# Patient Record
Sex: Female | Born: 1955 | Race: White | Hispanic: No | Marital: Married | Smoking: Former smoker
Health system: Southern US, Community
[De-identification: ages and names within clinical notes are randomized; demographics above are authoritative.]

## PROBLEM LIST (undated history)

## (undated) DIAGNOSIS — F419 Anxiety disorder, unspecified: Secondary | ICD-10-CM

## (undated) DIAGNOSIS — M1990 Unspecified osteoarthritis, unspecified site: Secondary | ICD-10-CM

## (undated) DIAGNOSIS — J449 Chronic obstructive pulmonary disease, unspecified: Secondary | ICD-10-CM

## (undated) DIAGNOSIS — J439 Emphysema, unspecified: Secondary | ICD-10-CM

## (undated) DIAGNOSIS — E119 Type 2 diabetes mellitus without complications: Secondary | ICD-10-CM

## (undated) DIAGNOSIS — Z87442 Personal history of urinary calculi: Secondary | ICD-10-CM

## (undated) DIAGNOSIS — G4730 Sleep apnea, unspecified: Secondary | ICD-10-CM

## (undated) HISTORY — PX: COLONOSCOPY: SHX174

## (undated) HISTORY — PX: EYE SURGERY: SHX253

## (undated) HISTORY — PX: APPENDECTOMY: SHX54

---

## 2002-09-26 ENCOUNTER — Encounter: Admission: RE | Admit: 2002-09-26 | Discharge: 2002-09-26 | Payer: Self-pay

## 2003-07-04 ENCOUNTER — Encounter: Admission: RE | Admit: 2003-07-04 | Discharge: 2003-10-02 | Payer: Self-pay | Admitting: Family Medicine

## 2004-03-09 ENCOUNTER — Ambulatory Visit (HOSPITAL_BASED_OUTPATIENT_CLINIC_OR_DEPARTMENT_OTHER): Admission: RE | Admit: 2004-03-09 | Discharge: 2004-03-09 | Payer: Self-pay | Admitting: Family Medicine

## 2004-04-09 ENCOUNTER — Other Ambulatory Visit: Admission: RE | Admit: 2004-04-09 | Discharge: 2004-04-09 | Payer: Self-pay | Admitting: Family Medicine

## 2004-09-11 ENCOUNTER — Encounter (INDEPENDENT_AMBULATORY_CARE_PROVIDER_SITE_OTHER): Payer: Self-pay | Admitting: *Deleted

## 2004-09-11 ENCOUNTER — Ambulatory Visit (HOSPITAL_COMMUNITY): Admission: RE | Admit: 2004-09-11 | Discharge: 2004-09-11 | Payer: Self-pay | Admitting: Obstetrics and Gynecology

## 2005-03-25 ENCOUNTER — Other Ambulatory Visit: Admission: RE | Admit: 2005-03-25 | Discharge: 2005-03-25 | Payer: Self-pay | Admitting: Obstetrics and Gynecology

## 2007-02-04 ENCOUNTER — Other Ambulatory Visit: Admission: RE | Admit: 2007-02-04 | Discharge: 2007-02-04 | Payer: Self-pay | Admitting: Family Medicine

## 2008-03-14 ENCOUNTER — Other Ambulatory Visit: Admission: RE | Admit: 2008-03-14 | Discharge: 2008-03-14 | Payer: Self-pay | Admitting: Family Medicine

## 2008-03-16 ENCOUNTER — Encounter: Admission: RE | Admit: 2008-03-16 | Discharge: 2008-03-16 | Payer: Self-pay | Admitting: Family Medicine

## 2008-03-30 ENCOUNTER — Encounter: Admission: RE | Admit: 2008-03-30 | Discharge: 2008-03-30 | Payer: Self-pay | Admitting: Family Medicine

## 2008-04-12 ENCOUNTER — Encounter: Admission: RE | Admit: 2008-04-12 | Discharge: 2008-04-12 | Payer: Self-pay | Admitting: Family Medicine

## 2008-12-13 ENCOUNTER — Encounter: Admission: RE | Admit: 2008-12-13 | Discharge: 2008-12-13 | Payer: Self-pay | Admitting: Family Medicine

## 2009-10-23 ENCOUNTER — Other Ambulatory Visit: Admission: RE | Admit: 2009-10-23 | Discharge: 2009-10-23 | Payer: Self-pay | Admitting: Family Medicine

## 2010-04-11 ENCOUNTER — Ambulatory Visit (HOSPITAL_COMMUNITY): Admission: RE | Admit: 2010-04-11 | Discharge: 2010-04-11 | Payer: Self-pay | Admitting: Family Medicine

## 2010-07-14 ENCOUNTER — Encounter: Payer: Self-pay | Admitting: Family Medicine

## 2010-11-07 NOTE — Op Note (Signed)
[name], [name] NO.:  [id number]   MEDICAL RECORD NO.:  [id number]          PATIENT TYPE:  AMB   LOCATION:  SDC                           FACILITY:  [facility]   PHYSICIAN:  Charles A. Delcambre, MDDATE OF BIRTH:  1955-07-27   DATE OF PROCEDURE:  09/11/2004  DATE OF DISCHARGE:                                 OPERATIVE REPORT   PREOPERATIVE DIAGNOSIS:  Menorrhagia.  Cervical stenosis.   POSTOPERATIVE DIAGNOSIS:  Menorrhagia.  Cervical stenosis.   PROCEDURE:  Dilatation and curettage.  Paracervical block.  Failed  hysteroscopy.   SURGEON:  Charles A. Sydnee Cabal, M.D.   ASSISTANT:  None.   COMPLICATIONS:  None.   ESTIMATED BLOOD LOSS:  Less than 25 mL.   SPECIMENS:  Endometrial curettings.   ANESTHESIA:  General by the endotracheal route.   COUNTS:  Needle, sponge, and instrument counts correct x2.   FINDINGS:  Extremely anteverted cervix complicating placement of  hysteroscope.  I could not get the hysteroscope to pass safely and for this  reason, without suspicious findings, 0.8 cm endometrial thickness on  ultrasound, I did not proceed with hysteroscopy further.   DESCRIPTION OF PROCEDURE:  The patient was taken to the operating room and  placed in the supine position.  General anesthesia was induced without  difficulty.  She was then placed in the dorsal lithotomy position in  Universal stirrups.  Sterile prep and drape was undertaken.  A single tooth  tenaculum was placed on the cervix with a weighted speculum in the vagina.  Sound was done with gentle pressure to penetrate cervical stenosis.  This  was done without evidence of perforation.  Sound was to 9 cm.  0.25% plain  Marcaine was placed at 4 and 8 o'clock, 20 mL divided equally.  Hanks  dilators were used to dilate enough to pass a small Banjo curet.  Hysteroscope could not be passed as noted above.  Gentle curettage  circumferentially was undertaken with a moderate amount of tissue  curetted.  There was no evidence of perforation.  The procedure was then terminated.  The tenaculum had pulled the cervix anteriorly.  A mild amount of bleeding  was noted.  For this reason 2-0 Vicryl was used in a running locking  suture, four stitches to close the small area of tear from the tenaculum.  Hemostasis was excellent.  All instruments were removed.  The patient  tolerated the procedure well and was awakened and taken to the recovery room  with physician in attendance.      CAD/MEDQ  D:  09/11/2004  T:  09/11/2004  Job:  [id number]

## 2010-11-07 NOTE — Procedures (Signed)
[name], Joanna NO.:  [id number]   MEDICAL RECORD NO.:  [id number]          PATIENT TYPE:  OUT   LOCATION:  SLEEP CENTER                 FACILITY:  [facility]   PHYSICIAN:  Clinton D. Maple Hudson, M.D. DATE OF BIRTH:  [birth year]   DATE OF STUDY:  03/09/2004  DATE OF DISCHARGE:  03/09/2004                              NOCTURNAL POLYSOMNOGRAM   REFERRING PHYSICIAN:  Dr. Elie Confer   INDICATION FOR STUDY:  Hypersomnia with sleep apnea.  Epworth sleepiness  score 8/24.  Neck size 17 inches.  Weight 225 pounds.   MEDICATIONS:  1.  Advil.  2.  Paxil.   SLEEP ARCHITECTURE:  Total sleep time 331 minutes with sleep efficiency 80%.  Stage I was 8%, stage II 57%, stages III and IV 13%.  REM was 22% of total  sleep time.  Sleep latency 25 minutes.  REM latency 263 minutes.  Awake  after sleep onset 64 minutes.  Arousal index 21.   RESPIRATORY DATA:  Split study protocol.  RDI 13.6/hour indicating mild  obstructive sleep apnea/hypopnea syndrome before CPAP titration.  This  included 16 obstructive apneas and 21 hypopneas.  She slept mostly on her  right side.  REM RDI was 0.  CPAP was titrated to 8 CWP, RDI 3/hour.  A  medium Respironics comfort select mask was used with heatage humidifier.   OXYGEN DATA:  Mild to moderate snoring with normal oxygenation throughout.  Oxygen saturation nadir was 90%.  After CPAP titration oxygen saturation  held 96-97%.   CARDIAC DATA:  Normal cardiac rhythm.   MOVEMENT/PARASOMNIA:  A total of 172 limb jerks were recorded of which 17  were associated with arousal or awakening for a periodic limb movement with  arousal index of 3.1/hour which is mildly elevated.   IMPRESSION/RECOMMENDATION:  Mild obstructive sleep apnea/hypopnea syndrome,  RDI 13.6/hour.  Normal oxygenation and cardiac rhythm.  Successful CPAP  titration to 8 CWP, RDI 3/hour using a medium Respironics comfort select  nasal mask with heatage humidifier.  Mild  periodic limb movement with  arousal, 4/hour.    CDY/MEDQ  D:  03/22/2004 16:28:50  T:  03/22/2004 17:26:25  Job:  [id number]

## 2010-11-07 NOTE — H&P (Signed)
Joanna Joanna [name], Joanna [name] NO.:  [id number]   MEDICAL RECORD NO.:  [id number]          PATIENT TYPE:  AMB   LOCATION:  SDC                           FACILITY:  [facility]   PHYSICIAN:  Charles A. Delcambre, MDDATE OF BIRTH:  1955/11/06   DATE OF ADMISSION:  09/11/2004  DATE OF DISCHARGE:                                HISTORY & PHYSICAL   The patient is to be admitted, on September 11, 2004, to undergo hysteroscopy  and dilatation and curettage for abnormal uterine bleeding.  She is a 55-  year-old, para 2-0-0-2, failing to respond to Aygestin and Provera.  I can  not get an endometrial biopsy because of a stenotic cervix.   PAST MEDICAL HISTORY:  1.  Depression.  2.  Anxiety.   SURGICAL HISTORY:  None.   MEDICATIONS:  Paxil 60 mg per day.   ALLERGIES:  PENICILLIN, reaction not specified.   SOCIAL HISTORY:  Smoking.  Denies alcohol or drug use, STD exposure.  She is  married and in a monogamous relationship with her husband.   FAMILY HISTORY:  Hypertension and diabetes in her sister.  Otherwise  negative.   REVIEW OF SYSTEMS:  No fever, chills, rashes, lesions, headaches, dizziness,  chest pain, shortness of breath, wheezing, diarrhea, constipation, bleeding,  urgency, frequency, or dysuria, incontinence, galactorrhea, emotional  changes.   PHYSICAL EXAMINATION:  HEENT:  Grossly negative.  NECK:  Normal.  LUNGS:  Normal.  Clear bilaterally.  HEART:  Regular rate and rhythm.  BREASTS:  Symmetrical otherwise declined by patient.  ABDOMEN:  Mildly protuberant, obese, soft, nontender.  PELVIC:  Normal external female genitalia.  Vault without discharge or  lesions.  Normal vagina.  Multiparous cervix.  No cervical motion  tenderness.  Uterus 8 to 10 weeks size.  Somewhat difficult to feel  secondary to the patient's body habitus.  Adnexal regions nontender without  masses bilaterally.  Ovaries not palpable secondary to the patient's body  habitus.  RECTAL:  Not done.   External hemorrhoids not present.  Anus perineal body  normal.   ASSESSMENT:  Abnormal uterine bleeding, menorrhagia.   PLAN:  Hysteroscopy D&C.  The patient accepts risk of infection, bowel or  bladder damage, uterine perforation, anesthesia risks in general, blood risk  including HIV and hepatitis.  All questions were answered.  She will remain  NPO after midnight pre-op.  Chem-20, CBC, serum HCG will be drawn.  All  questions were answered.  We will proceed as outlined.      CAD/MEDQ  D:  09/03/2004  T:  09/03/2004  Job:  [id number]

## 2011-06-24 ENCOUNTER — Other Ambulatory Visit (HOSPITAL_COMMUNITY)
Admission: RE | Admit: 2011-06-24 | Discharge: 2011-06-24 | Disposition: A | Discharge status: Home / Self Care | Payer: BC Managed Care – PPO | Source: Ambulatory Visit | Attending: Family Medicine | Admitting: Family Medicine

## 2011-06-24 ENCOUNTER — Other Ambulatory Visit: Payer: Self-pay | Admitting: Family Medicine

## 2011-06-24 DIAGNOSIS — R918 Other nonspecific abnormal finding of lung field: Secondary | ICD-10-CM

## 2011-06-24 DIAGNOSIS — Z124 Encounter for screening for malignant neoplasm of cervix: Secondary | ICD-10-CM | POA: Insufficient documentation

## 2011-06-29 ENCOUNTER — Ambulatory Visit
Admission: RE | Admit: 2011-06-29 | Discharge: 2011-06-29 | Disposition: A | Discharge status: Home / Self Care | Payer: BC Managed Care – PPO | Source: Ambulatory Visit | Attending: Family Medicine | Admitting: Family Medicine

## 2011-06-29 DIAGNOSIS — R918 Other nonspecific abnormal finding of lung field: Secondary | ICD-10-CM

## 2011-06-29 MED ORDER — IOHEXOL 300 MG/ML  SOLN
75.0000 mL | Freq: Once | INTRAMUSCULAR | Status: AC | PRN
  Administered 2011-06-29: 75 mL via INTRAVENOUS

## 2012-11-23 IMAGING — CT CT CHEST W/ CM
1 of 2 series · 15 of 32 positions shown, 19 images · IV contrast (75CC OMNI 300)
Comparison: 12/13/2008 and 04/12/2008.

CLINICAL DATA: Follow-up pulmonary nodules.  Cough.

CT CHEST WITH CONTRAST
TECHNIQUE: Multidetector CT imaging of the chest was performed
following the standard protocol during bolus administration of
intravenous contrast.
Contrast: 75mL OMNIPAQUE IOHEXOL 300 MG/ML IV SOLN
BUN and creatinine were obtained on site at [HOSPITAL] at
[HOSPITAL]..
Results:  BUN 11 mg/dL,  Creatinine 0.5 mg/dL.

[Series 4: thin recons · axial · 0.62mm/px · z∈[-364,-78]mm · 15 of 316 slices shown, 19 images]
[im 15/316  mediastinal]
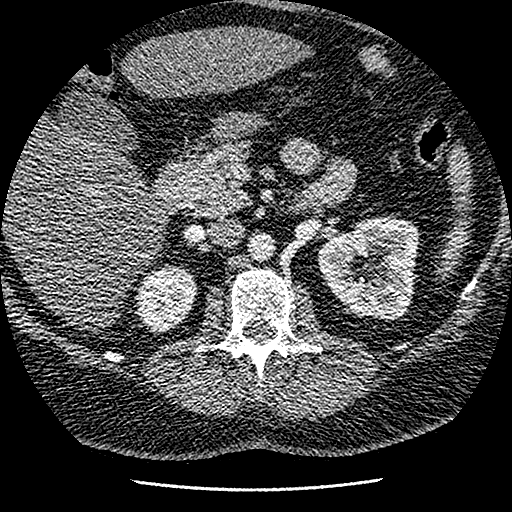
[im 15/316  lung]
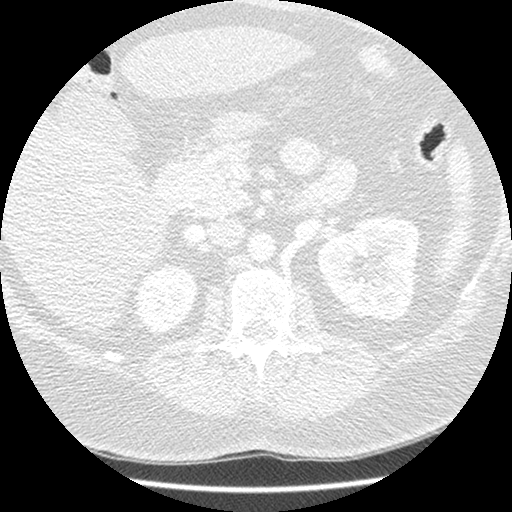
[im 43/316  lung]
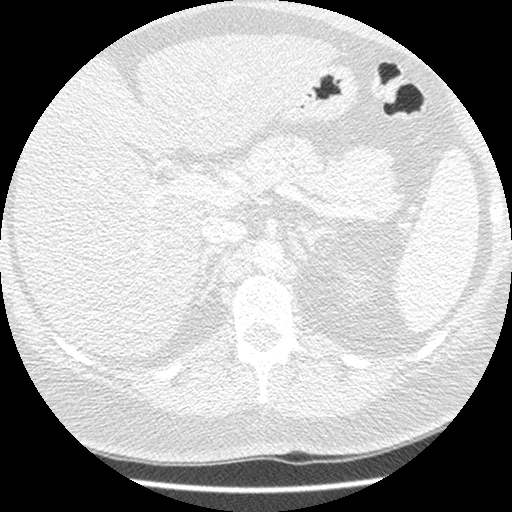
[im 72/316  lung]
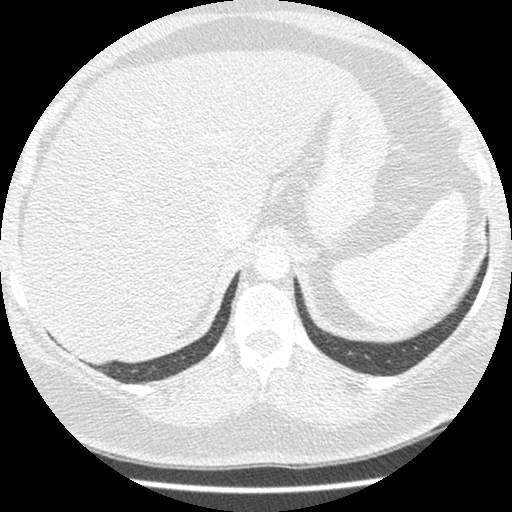
[im 79/316  lung]
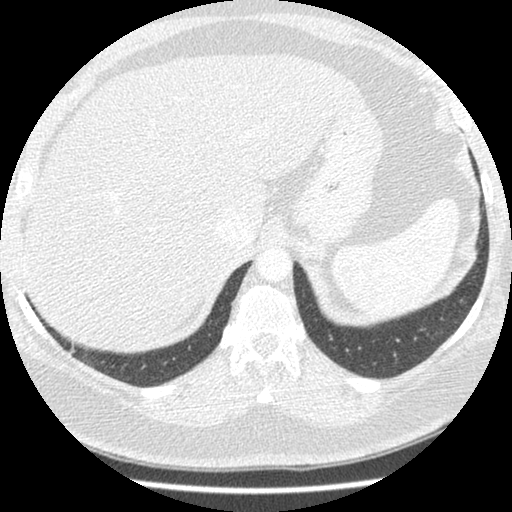
[im 101/316  mediastinal]
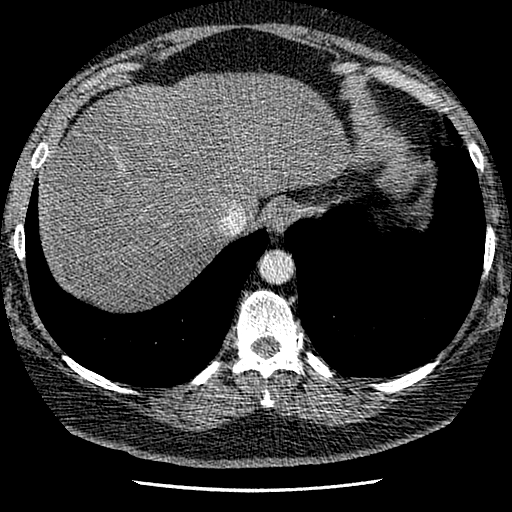
[im 101/316  lung]
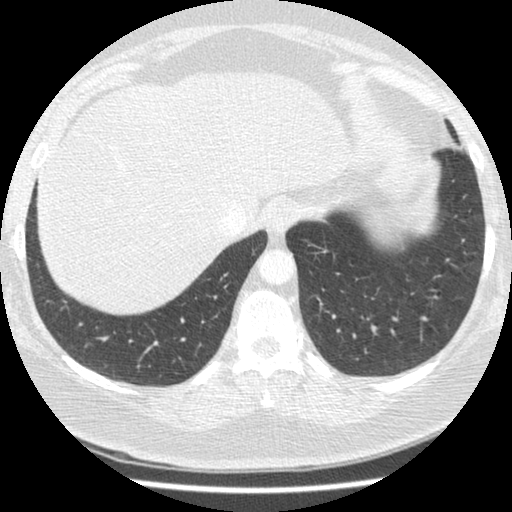
[im 115/316  lung]
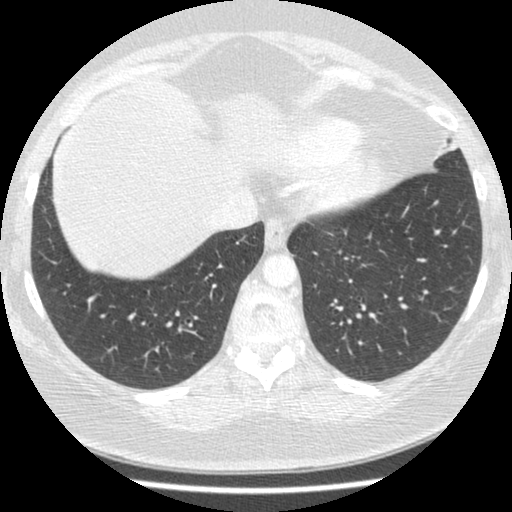
[im 144/316  lung]
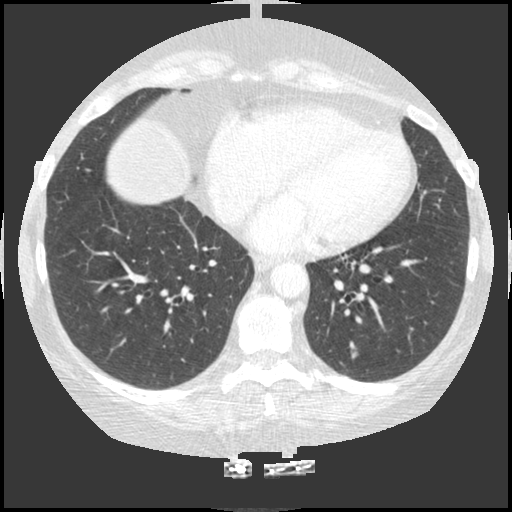
[im 158/316  lung]
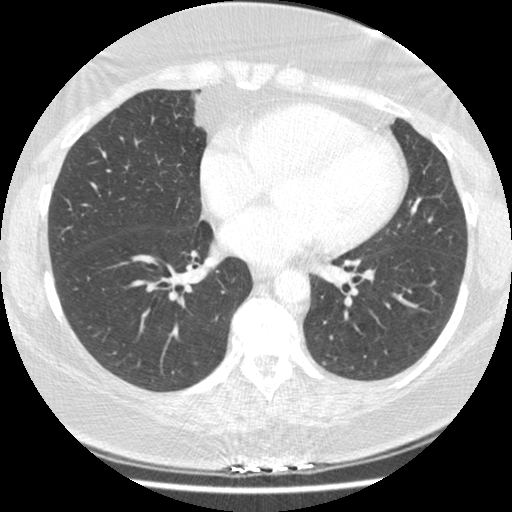
[im 172/316  mediastinal]
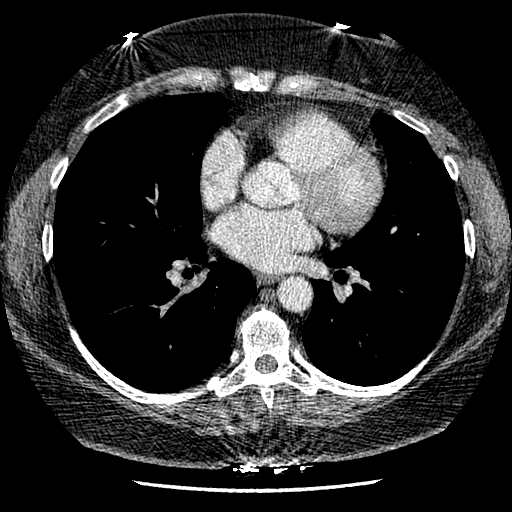
[im 172/316  lung]
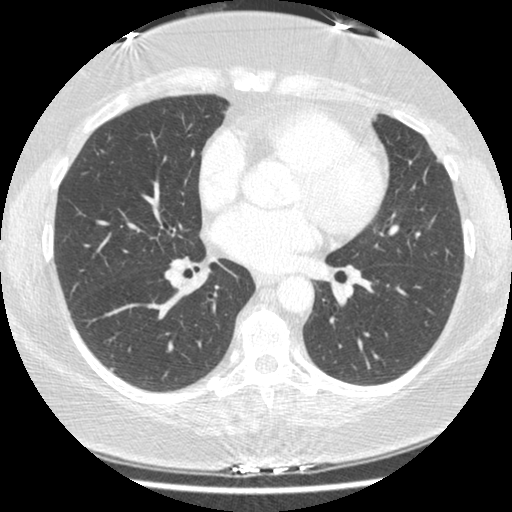
[im 201/316  lung]
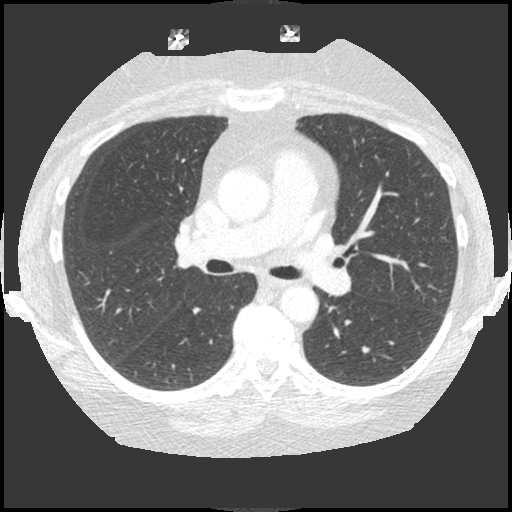
[im 215/316  lung]
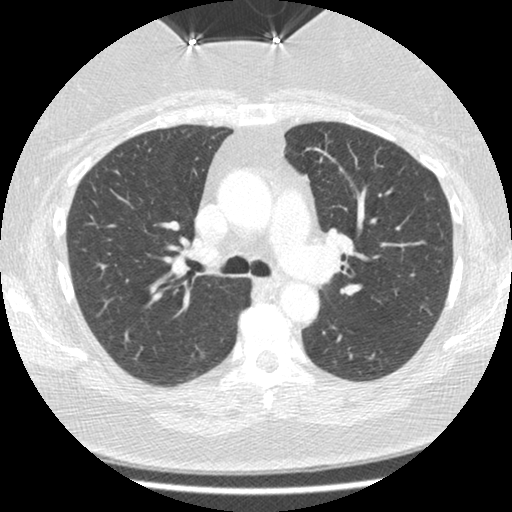
[im 237/316  lung]
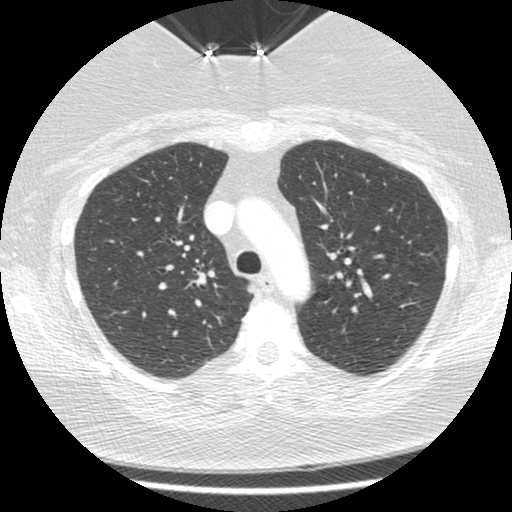
[im 258/316  mediastinal]
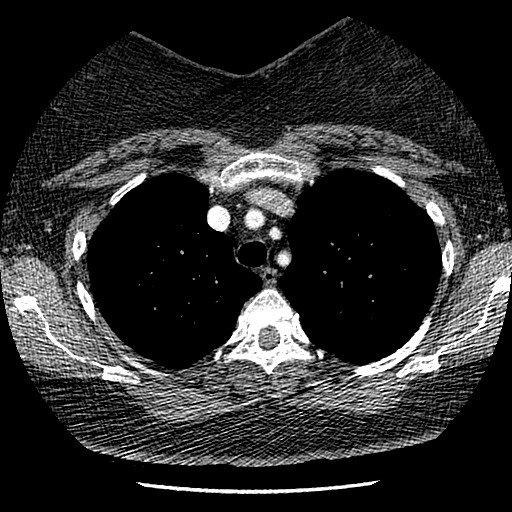
[im 258/316  lung]
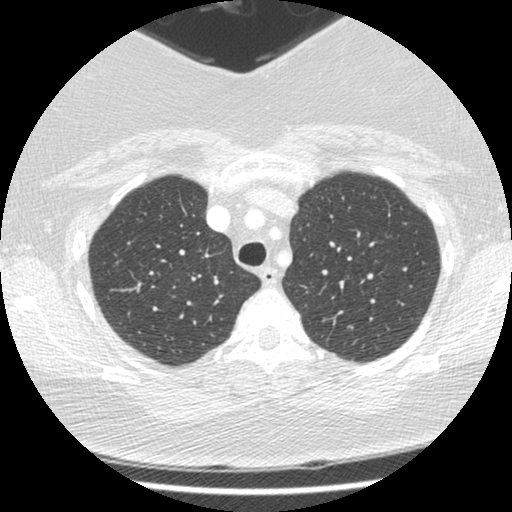
[im 273/316  lung]
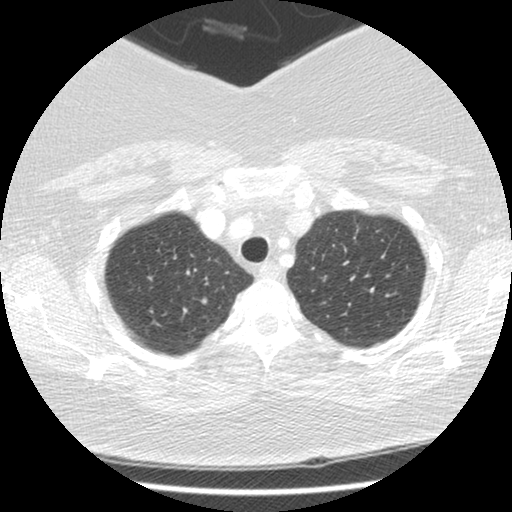
[im 301/316  lung]
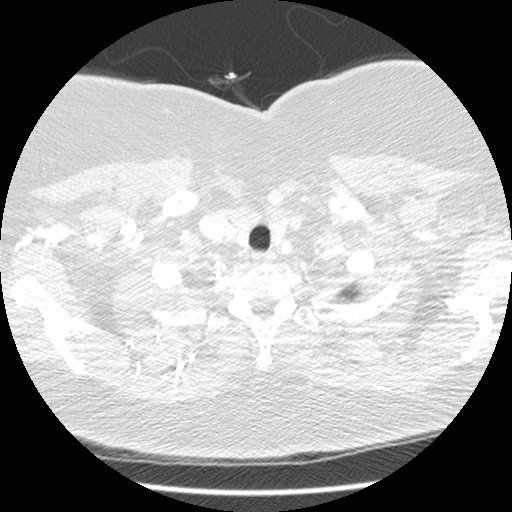

[15 of 32 positions shown; findings below may reference images not displayed]

FINDINGS: No pathologically enlarged mediastinal, hilar or axillary
lymph nodes.  Heart size normal.  No pericardial effusion.

Scattered bilateral pulmonary nodules measure 5 mm or less in size
and are unchanged from 12/13/2008.  No pleural fluid.  Airway is
unremarkable.

Incidental imaging of the upper abdomen shows low attenuation
throughout the visualized portion of the liver.  No worrisome lytic
or sclerotic lesions.
IMPRESSION: 1.  Scattered small bilateral pulmonary nodules are unchanged from
12/13/2008 and are considered benign.
2.  Hepatic steatosis.

## 2013-10-11 ENCOUNTER — Ambulatory Visit
Admission: RE | Admit: 2013-10-11 | Discharge: 2013-10-11 | Disposition: A | Discharge status: Home / Self Care | Payer: BC Managed Care – PPO | Source: Ambulatory Visit | Attending: Family Medicine | Admitting: Family Medicine

## 2013-10-11 ENCOUNTER — Other Ambulatory Visit: Payer: Self-pay | Admitting: Family Medicine

## 2013-10-11 DIAGNOSIS — M25569 Pain in unspecified knee: Secondary | ICD-10-CM

## 2014-09-03 ENCOUNTER — Other Ambulatory Visit: Payer: Self-pay | Admitting: Family Medicine

## 2014-09-03 ENCOUNTER — Other Ambulatory Visit (HOSPITAL_COMMUNITY)
Admission: RE | Admit: 2014-09-03 | Discharge: 2014-09-03 | Disposition: A | Discharge status: Home / Self Care | Payer: BLUE CROSS/BLUE SHIELD | Source: Ambulatory Visit | Attending: Family Medicine | Admitting: Family Medicine

## 2014-09-03 DIAGNOSIS — Z124 Encounter for screening for malignant neoplasm of cervix: Secondary | ICD-10-CM | POA: Insufficient documentation

## 2014-09-03 DIAGNOSIS — Z1151 Encounter for screening for human papillomavirus (HPV): Secondary | ICD-10-CM | POA: Diagnosis present

## 2014-09-04 LAB — CYTOLOGY - PAP

## 2016-04-13 ENCOUNTER — Ambulatory Visit: Payer: BLUE CROSS/BLUE SHIELD | Admitting: Endocrinology

## 2016-06-18 ENCOUNTER — Encounter: Payer: BLUE CROSS/BLUE SHIELD | Attending: Family Medicine | Admitting: Skilled Nursing Facility1

## 2016-06-18 DIAGNOSIS — Z713 Dietary counseling and surveillance: Secondary | ICD-10-CM | POA: Diagnosis not present

## 2016-06-18 DIAGNOSIS — Z6836 Body mass index (BMI) 36.0-36.9, adult: Secondary | ICD-10-CM | POA: Diagnosis not present

## 2016-06-18 DIAGNOSIS — E119 Type 2 diabetes mellitus without complications: Secondary | ICD-10-CM

## 2016-06-18 NOTE — Patient Instructions (Addendum)
-  Always bring your meter with you everywhere you go -Always Properly dispose of your needles:  -Discard in a hard plastic/metal container with a lid (something the needle can't puncture)  -Write Do Not Recycle on the outside of the container  -Example: A laundry detergent bottle -Never use the same needle more than once -Eat 2-3 carbohydrate choices for each meal and 0-1 for each snack -A meal: carbohydrates, protein, vegetable -A snack: A Fruit OR Vegetable AND Protein  -Consider adding more activity, hiking, walking -Always pay attention to your body keeping watchful of possible low blood sugar (below 70) or high blood sugar (above 200) ? Check your feet every day looking for anything that was not there the day before ? Continue yoga ? Consider checking blood sugar once a day alternating fasting and 2 hrs after eating or exercise ? Search for a Carbohydrate or Calorie Counting "App" Sparkpeople (iPhone, Android) ? Fit Day Engineer, building services(iPhone, Android) ? Carbs Control (iPhone, Android) ? Track3Diabetes (iPhone, Android)  Go to www.calorieking.com or www.carbscontrol.com to look up restaurant informationFit Day Engineer, building services(iPhone, Android) Risk analystCarbs Contr

## 2016-06-18 NOTE — Progress Notes (Signed)
Diabetes Self-Management Education  Visit Type: First/Initial  Appt. Start Time: 11:00 am Appt. End Time: 12:30 pm  06/18/2016  Joanna [name], identified by name and date of birth, is a 60 y.o. female with a diagnosis of Diabetes: Type 2. This patient is accompanied in the office by her spouse. Patient reports having diabetes educations twice since her diagnosis in 2009, and demonstrated basic understanding of carbohydrates and portion control. Answered patient questions about the disease process. Patient and spouse participated in exploring meal options at home and eating out as well as strategies to overcome barriers to lifestyle changes.   ASSESSMENT  Height 5\' 5"  (1.651 m), weight 218 lb 3.2 oz (99 kg). Body mass index is 36.31 kg/m.      Diabetes Self-Management Education - 06/18/16 1109      Visit Information   Visit Type First/Initial     Initial Visit   Diabetes Type Type 2   Are you currently following a meal plan? No   Are you taking your medications as prescribed? Yes   Date Diagnosed 2009     Health Coping   How would you rate your overall health? Fair     Psychosocial Assessment   Patient Belief/Attitude about Diabetes Defeat/Burnout   Self-management support Family   Other persons present Spouse/SO   Patient Concerns Nutrition/Meal planning   Preferred Learning Style Auditory;Other (comment)  reading material     Pre-Education Assessment   Patient understands the diabetes disease and treatment process. Needs Instruction   Patient understands incorporating nutritional management into lifestyle. Needs Review   Patient undertands incorporating physical activity into lifestyle. Needs Instruction   Patient understands how to develop strategies to address psychosocial issues. Needs Review   Patient understands how to develop strategies to promote health/change behavior. Needs Review     Complications   Last HgB A1C per patient/outside source 8.4 %   How often  do you check your blood sugar? 0 times/day (not testing)   Have you had a dilated eye exam in the past 12 months? Yes   Have you had a dental exam in the past 12 months? No   Are you checking your feet? Yes   How many days per week are you checking your feet? 3     Exercise   Exercise Type Light (walking / raking leaves)   How many days per week to you exercise? 2  yoga   How many minutes per day do you exercise? 45   Total minutes per week of exercise 90     Patient Education   Previous Diabetes Education Yes (please comment)  2 times since initial diagnosis   Disease state  Definition of diabetes, type 1 and 2, and the diagnosis of diabetes   Nutrition management  Role of diet in the treatment of diabetes and the relationship between the three main macronutrients and blood glucose level;Food label reading, portion sizes and measuring food.;Carbohydrate counting;Reviewed blood glucose goals for pre and post meals and how to evaluate the patients' food intake on their blood glucose level.;Meal options for control of blood glucose level and chronic complications.   Chronic complications Relationship between chronic complications and blood glucose control   Psychosocial adjustment Role of stress on diabetes;Worked with patient to identify barriers to care and solutions;Helped patient identify a support system for diabetes management     Individualized Goals (developed by patient)   Nutrition Follow meal plan discussed   Physical Activity Exercise 3-5 times per week  Monitoring  test my blood glucose as discussed     Post-Education Assessment   Patient understands the diabetes disease and treatment process. Demonstrates understanding / competency   Patient understands incorporating nutritional management into lifestyle. Demonstrates understanding / competency   Patient undertands incorporating physical activity into lifestyle. Demonstrates understanding / competency   Patient understands  how to develop strategies to address psychosocial issues. Demonstrates understanding / competency   Patient understands how to develop strategies to promote health/change behavior. Demonstrates understanding / competency     Outcomes   Expected Outcomes Demonstrated interest in learning. Expect positive outcomes   Future DMSE PRN   Program Status Completed      Individualized Plan for Diabetes Self-Management Training:   Learning Objective:  Patient will have a greater understanding of diabetes self-management. Patient education plan is to attend individual and/or group sessions per assessed needs and concerns.   Plan:   Patient Instructions  -Always bring your meter with you everywhere you go -Always Properly dispose of your needles:  -Discard in a hard plastic/metal container with a lid (something the needle can't puncture)  -Write Do Not Recycle on the outside of the container  -Example: A laundry detergent bottle -Never use the same needle more than once -Eat 2-3 carbohydrate choices for each meal and 0-1 for each snack -A meal: carbohydrates, protein, vegetable -A snack: A Fruit OR Vegetable AND Protein  -Consider adding more activity, hiking, walking -Always pay attention to your body keeping watchful of possible low blood sugar (below 70) or high blood sugar (above 200) Check your feet every day looking for anything that was not there the day before  Carbohydrate or Calorie Counting "App" Sparkpeople (iPhone, Android) ? Continue yoga ? Consider checking blood sugar once a day alternating fasting and 2 hrs after eating or exercise ? Search for a Carbohydrate or Calorie Counting "App" Sparkpeople (iPhone, Android) ? Fit Day Engineer, building services(iPhone, Android) ? Carbs Control (iPhone, Android) ? Track3Diabetes (iPhone, Android)  Go to www.calorieking.com or www.carbscontrol.com to look up restaurant informationFit Day (iPhone, Android) Pharmacist, communityCarbs Control (i  Phone, Android) Track3Diabetes  (iPhone, Androi   Expected Outcomes:  Demonstrated interest in learning. Expect positive outcomes  Education material provided: Living Well with Diabetes, Food label handouts, Meal plan card, Snack sheet, Support group flyer and Carbohydrate counting sheet  If problems or questions, patient to contact team via:  Phone and Email  Future DSME appointment: PRN

## 2017-10-12 ENCOUNTER — Other Ambulatory Visit: Payer: Self-pay | Admitting: Family Medicine

## 2017-10-12 ENCOUNTER — Other Ambulatory Visit (HOSPITAL_COMMUNITY)
Admission: RE | Admit: 2017-10-12 | Discharge: 2017-10-12 | Disposition: A | Discharge status: Home / Self Care | Payer: BLUE CROSS/BLUE SHIELD | Source: Ambulatory Visit | Attending: Family Medicine | Admitting: Family Medicine

## 2017-10-12 DIAGNOSIS — Z01411 Encounter for gynecological examination (general) (routine) with abnormal findings: Secondary | ICD-10-CM | POA: Diagnosis present

## 2017-10-14 LAB — CYTOLOGY - PAP
Diagnosis: NEGATIVE
HPV: NOT DETECTED

## 2018-11-28 ENCOUNTER — Other Ambulatory Visit: Payer: Self-pay | Admitting: Family Medicine

## 2019-03-22 ENCOUNTER — Other Ambulatory Visit: Payer: Self-pay | Admitting: Family Medicine

## 2019-03-22 DIAGNOSIS — Z1231 Encounter for screening mammogram for malignant neoplasm of breast: Secondary | ICD-10-CM

## 2019-05-05 ENCOUNTER — Ambulatory Visit
Admission: RE | Admit: 2019-05-05 | Discharge: 2019-05-05 | Disposition: A | Discharge status: Home / Self Care | Payer: Medicare Other | Source: Ambulatory Visit | Attending: Family Medicine | Admitting: Family Medicine

## 2019-05-05 ENCOUNTER — Other Ambulatory Visit: Payer: Self-pay

## 2019-05-05 DIAGNOSIS — Z1231 Encounter for screening mammogram for malignant neoplasm of breast: Secondary | ICD-10-CM

## 2020-07-18 ENCOUNTER — Other Ambulatory Visit: Payer: Self-pay | Admitting: Family Medicine

## 2020-07-18 DIAGNOSIS — Z1231 Encounter for screening mammogram for malignant neoplasm of breast: Secondary | ICD-10-CM

## 2020-07-19 ENCOUNTER — Other Ambulatory Visit: Payer: Self-pay

## 2020-07-19 ENCOUNTER — Ambulatory Visit
Admission: RE | Admit: 2020-07-19 | Discharge: 2020-07-19 | Disposition: A | Discharge status: Home / Self Care | Payer: Medicare Other | Source: Ambulatory Visit | Attending: Family Medicine | Admitting: Family Medicine

## 2020-07-19 DIAGNOSIS — Z1231 Encounter for screening mammogram for malignant neoplasm of breast: Secondary | ICD-10-CM

## 2020-08-28 ENCOUNTER — Ambulatory Visit
Admission: RE | Admit: 2020-08-28 | Discharge: 2020-08-28 | Disposition: A | Discharge status: Home / Self Care | Payer: Medicare Other | Source: Ambulatory Visit | Attending: Family Medicine | Admitting: Family Medicine

## 2020-08-28 ENCOUNTER — Other Ambulatory Visit: Payer: Self-pay | Admitting: Family Medicine

## 2020-08-28 DIAGNOSIS — M25552 Pain in left hip: Secondary | ICD-10-CM

## 2020-08-28 DIAGNOSIS — M25562 Pain in left knee: Secondary | ICD-10-CM

## 2020-09-07 ENCOUNTER — Emergency Department (HOSPITAL_BASED_OUTPATIENT_CLINIC_OR_DEPARTMENT_OTHER): Payer: Medicare Other

## 2020-09-07 ENCOUNTER — Other Ambulatory Visit: Payer: Self-pay

## 2020-09-07 ENCOUNTER — Encounter (HOSPITAL_BASED_OUTPATIENT_CLINIC_OR_DEPARTMENT_OTHER): Payer: Self-pay

## 2020-09-07 ENCOUNTER — Emergency Department (HOSPITAL_BASED_OUTPATIENT_CLINIC_OR_DEPARTMENT_OTHER)
Admission: EM | Admit: 2020-09-07 | Discharge: 2020-09-07 | Disposition: A | Discharge status: Home / Self Care | Payer: Medicare Other | Attending: Emergency Medicine | Admitting: Emergency Medicine

## 2020-09-07 DIAGNOSIS — N1330 Unspecified hydronephrosis: Secondary | ICD-10-CM

## 2020-09-07 DIAGNOSIS — N132 Hydronephrosis with renal and ureteral calculous obstruction: Secondary | ICD-10-CM | POA: Insufficient documentation

## 2020-09-07 DIAGNOSIS — N202 Calculus of kidney with calculus of ureter: Secondary | ICD-10-CM

## 2020-09-07 DIAGNOSIS — E119 Type 2 diabetes mellitus without complications: Secondary | ICD-10-CM | POA: Diagnosis not present

## 2020-09-07 DIAGNOSIS — Z7984 Long term (current) use of oral hypoglycemic drugs: Secondary | ICD-10-CM | POA: Diagnosis not present

## 2020-09-07 DIAGNOSIS — R1030 Lower abdominal pain, unspecified: Secondary | ICD-10-CM | POA: Diagnosis present

## 2020-09-07 HISTORY — DX: Type 2 diabetes mellitus without complications: E11.9

## 2020-09-07 LAB — COMPREHENSIVE METABOLIC PANEL
ALT: 11 U/L (ref 0–44)
AST: 23 U/L (ref 15–41)
Albumin: 4.2 g/dL (ref 3.5–5.0)
Alkaline Phosphatase: 85 U/L (ref 38–126)
Anion gap: 12 (ref 5–15)
BUN: 17 mg/dL (ref 8–23)
CO2: 24 mmol/L (ref 22–32)
Calcium: 9 mg/dL (ref 8.9–10.3)
Chloride: 103 mmol/L (ref 98–111)
Creatinine, Ser: 0.87 mg/dL (ref 0.44–1.00)
GFR, Estimated: >60 mL/min (ref >60)
Glucose, Bld: 342 mg/dL — ABNORMAL HIGH (ref 70–99)
Potassium: 4.4 mmol/L (ref 3.5–5.1)
Sodium: 139 mmol/L (ref 135–145)
Total Bilirubin: 0.4 mg/dL (ref 0.3–1.2)
Total Protein: 7 g/dL (ref 6.5–8.1)

## 2020-09-07 LAB — CBC WITH DIFFERENTIAL/PLATELET
Abs Immature Granulocytes: 0.05 10*3/uL (ref 0.00–0.07)
Basophils Absolute: 0.1 10*3/uL (ref 0.0–0.1)
Basophils Relative: 1 %
Eosinophils Absolute: 0.1 10*3/uL (ref 0.0–0.5)
Eosinophils Relative: 1 %
HCT: 34.8 % — ABNORMAL LOW (ref 36.0–46.0)
Hemoglobin: 11.2 g/dL — ABNORMAL LOW (ref 12.0–15.0)
Immature Granulocytes: 1 %
Lymphocytes Relative: 13 %
Lymphs Abs: 1.3 10*3/uL (ref 0.7–4.0)
MCH: 28.7 pg (ref 26.0–34.0)
MCHC: 32.2 g/dL (ref 30.0–36.0)
MCV: 89.2 fL (ref 80.0–100.0)
Monocytes Absolute: 0.4 10*3/uL (ref 0.1–1.0)
Monocytes Relative: 4 %
Neutro Abs: 7.9 10*3/uL — ABNORMAL HIGH (ref 1.7–7.7)
Neutrophils Relative %: 80 %
Platelets: 237 10*3/uL (ref 150–400)
RBC: 3.9 MIL/uL (ref 3.87–5.11)
RDW: 13.4 % (ref 11.5–15.5)
WBC: 9.7 10*3/uL (ref 4.0–10.5)
nRBC: 0 % (ref 0.0–0.2)

## 2020-09-07 LAB — URINALYSIS, ROUTINE W REFLEX MICROSCOPIC
Bilirubin Urine: NEGATIVE
Glucose, UA: >1000 mg/dL — AB
Ketones, ur: 15 mg/dL — AB
Nitrite: NEGATIVE
Protein, ur: 100 mg/dL — AB
Specific Gravity, Urine: 1.021 (ref 1.005–1.030)
pH: 5.5 (ref 5.0–8.0)

## 2020-09-07 LAB — LIPASE, BLOOD: Lipase: 149 U/L — ABNORMAL HIGH (ref 11–51)

## 2020-09-07 MED ORDER — FENTANYL CITRATE (PF) 100 MCG/2ML IJ SOLN
50.0000 ug | Freq: Once | INTRAMUSCULAR | Status: AC
Start: 2020-09-07 — End: 2020-09-07
  Administered 2020-09-07: 50 ug via INTRAVENOUS
  Filled 2020-09-07: qty 2

## 2020-09-07 MED ORDER — HYDROCODONE-ACETAMINOPHEN 5-325 MG PO TABS: 2.0000 | ORAL_TABLET | ORAL | 0 refills | Status: DC | PRN

## 2020-09-07 MED ORDER — ONDANSETRON HCL 4 MG PO TABS: 4.0000 mg | ORAL_TABLET | Freq: Four times a day (QID) | ORAL | 0 refills | Status: AC

## 2020-09-07 MED ORDER — KETOROLAC TROMETHAMINE 30 MG/ML IJ SOLN
30.0000 mg | Freq: Once | INTRAMUSCULAR | Status: AC
  Administered 2020-09-07: 30 mg via INTRAVENOUS
  Filled 2020-09-07: qty 1

## 2020-09-07 MED ORDER — SODIUM CHLORIDE 0.9 % IV BOLUS
1000.0000 mL | Freq: Once | INTRAVENOUS | Status: AC
  Administered 2020-09-07: 1000 mL via INTRAVENOUS

## 2020-09-07 MED ORDER — FENTANYL CITRATE (PF) 100 MCG/2ML IJ SOLN
100.0000 ug | Freq: Once | INTRAMUSCULAR | Status: AC
  Administered 2020-09-07: 100 ug via INTRAVENOUS
  Filled 2020-09-07: qty 2

## 2020-09-07 MED ORDER — TAMSULOSIN HCL 0.4 MG PO CAPS
0.4000 mg | ORAL_CAPSULE | Freq: Every day | ORAL | 0 refills | Status: AC
Start: 2020-09-07 — End: 2020-09-21

## 2020-09-07 MED ORDER — ONDANSETRON HCL 4 MG/2ML IJ SOLN
4.0000 mg | Freq: Once | INTRAMUSCULAR | Status: AC
  Administered 2020-09-07: 4 mg via INTRAVENOUS
  Filled 2020-09-07: qty 2

## 2020-09-07 NOTE — ED Provider Notes (Signed)
[facility] DEPT Provider Note   CSN: [id number] Arrival date & time: 09/07/20  4098     History Chief Complaint  Patient presents with  . Back Pain    na  . Nausea    Joanna [name] is a 65 y.o. female.  HPI     65 year old female with history of diabetes presents with sudden onset right flank and lower abdominal pain beginning at 4 AM today.  Reports she woke this a.m. with severe, sharp abdominal and flank pain.  Reports is located over the right flank and radiates towards her right lower quadrant.  Severe, nothing makes it better or worse.  Has not had pain like this before.  Reports she is had nausea, but no vomiting.  Denies diarrhea, constipation, vaginal bleeding or discharge.  Denies dysuria, hematuria or frequency.  No history of nephrolithiasis.  No chest pain, shortness of breath or cough.   Past Medical History:  Diagnosis Date  . Diabetes mellitus without complication ([place])     There are no problems to display for this patient.   Past Surgical History:  Procedure Laterality Date  . APPENDECTOMY       OB History   No obstetric history on file.     History reviewed. No pertinent family history.  Social History   Tobacco Use  . Smoking status: Never Smoker  . Smokeless tobacco: Never Used  Substance Use Topics  . Alcohol use: Not Currently  . Drug use: Not Currently    Home Medications Prior to Admission medications   Medication Sig Start Date End Date Taking? Authorizing Provider  Blood Glucose Monitoring Suppl ([place]) w/Device KIT check blood sugar 07/29/20  Yes [provider]  Blood Glucose Monitoring Suppl (ONETOUCH VERIO) w/Device KIT See admin instructions. 08/01/20  Yes [provider]  HYDROcodone-acetaminophen (NORCO/VICODIN) 5-325 MG tablet Take 2 tablets by mouth every 4 (four) hours as needed. 09/07/20  Yes Gareth Morgan, MD  losartan (COZAAR) 25 MG tablet Take 0.5 tablets  by mouth daily. 01/06/13  Yes [provider]  ondansetron (ZOFRAN) 4 MG tablet Take 1 tablet (4 mg total) by mouth every 6 (six) hours. 09/07/20  Yes Gareth Morgan, MD  simvastatin (ZOCOR) 40 MG tablet simvastatin 40 mg tablet 10/11/13  Yes [provider]  sitaGLIPtin (JANUVIA) 100 MG tablet 1 tablet 08/20/20  Yes [provider]  tamsulosin (FLOMAX) 0.4 MG CAPS capsule Take 1 capsule (0.4 mg total) by mouth daily for 14 days. 09/07/20 09/21/20 Yes Gareth Morgan, MD  Blood Glucose Monitoring Suppl (ONETOUCH VERIO FLEX SYSTEM) w/Device KIT CHECK BLOOD SUGAR ONCE A DAY 07/29/20   [provider]  glimepiride (AMARYL) 2 MG tablet take 2 tablets once daily  with breakfast, and 1 tab with lunch    [provider]  simvastatin (ZOCOR) 10 MG tablet 1 tablet    [provider]  venlafaxine (EFFEXOR) 75 MG tablet Take 1 tablet by mouth daily.    [provider]    Allergies    Lisinopril, Oxycodone-acetaminophen, and Penicillins  Review of Systems   Review of Systems  Constitutional: Negative for fever.  Eyes: Negative for visual disturbance.  Respiratory: Negative for cough and shortness of breath.   Cardiovascular: Negative for chest pain.  Gastrointestinal: Positive for abdominal pain and nausea. Negative for constipation, diarrhea and vomiting.  Genitourinary: Positive for flank pain. Negative for difficulty urinating, dysuria, vaginal bleeding and vaginal discharge.  Musculoskeletal: Positive for back pain.  Negative for neck pain.  Skin: Negative for rash.  Neurological: Negative for syncope and light-headedness.    Physical Exam Updated Vital Signs BP (!) 147/70 (BP Location: Right Arm)   Pulse 68   Temp 98.1 F (36.7 C) (Oral)   Resp 18   Ht _0  (1.676 m)   Wt 95.3 kg   SpO2 98%   BMI 33.89 kg/m   Physical Exam Vitals and nursing note reviewed.  Constitutional:      General: She is in acute distress (pain).      Appearance: She is well-developed. She is not diaphoretic.  HENT:     Head: Normocephalic and atraumatic.  Eyes:     Conjunctiva/sclera: Conjunctivae normal.  Cardiovascular:     Rate and Rhythm: Normal rate and regular rhythm.     Heart sounds: Normal heart sounds. No murmur heard. No friction rub. No gallop.   Pulmonary:     Effort: Pulmonary effort is normal. No respiratory distress.     Breath sounds: Normal breath sounds. No wheezing or rales.  Abdominal:     General: There is no distension.     Palpations: Abdomen is soft.     Tenderness: There is abdominal tenderness (RLQ, RUQ). There is no right CVA tenderness, left CVA tenderness or guarding.  Musculoskeletal:        General: No tenderness.     Cervical back: Normal range of motion.  Skin:    General: Skin is warm and dry.     Findings: No erythema or rash.  Neurological:     Mental Status: She is alert and oriented to person, place, and time.     ED Results / Procedures / Treatments   Labs (all labs ordered are listed, but only abnormal results are displayed) Labs Reviewed  CBC WITH DIFFERENTIAL/PLATELET - Abnormal; Notable for the following components:      Result Value   Hemoglobin 11.2 (*)    HCT 34.8 (*)    Neutro Abs 7.9 (*)    All other components within normal limits  COMPREHENSIVE METABOLIC PANEL - Abnormal; Notable for the following components:   Glucose, Bld 342 (*)    All other components within normal limits  URINALYSIS, ROUTINE W REFLEX MICROSCOPIC - Abnormal; Notable for the following components:   APPearance CLOUDY (*)    Glucose, UA >1,000 (*)    Hgb urine dipstick SMALL (*)    Ketones, ur 15 (*)    Protein, ur 100 (*)    Leukocytes,Ua MODERATE (*)    Bacteria, UA RARE (*)    Crystals PRESENT (*)    All other components within normal limits  LIPASE, BLOOD - Abnormal; Notable for the following components:   Lipase 149 (*)    All other components within normal limits  URINE CULTURE     EKG None  Radiology CT Renal Stone Study  Result Date: 09/07/2020 CLINICAL DATA:  Flank pain, assess for kidney stones. EXAM: CT ABDOMEN AND PELVIS WITHOUT CONTRAST TECHNIQUE: Multidetector CT imaging of the abdomen and pelvis was performed following the standard protocol without IV contrast. COMPARISON:  March 30, 2008 FINDINGS: Lower chest: No acute abnormality. Hepatobiliary: No focal liver abnormality is seen. No gallstones, gallbladder wall thickening, or biliary dilatation. Pancreas: Small calcification is identified in the inferior pancreatic head. Pancreas is otherwise normal. Spleen: Normal in size without focal abnormality. Adrenals/Urinary Tract: There is right hydroureteronephrosis. There is a question 1 mm stone in the proximal right ureter, image 55 series  2, there is a question 1 mm stone in the right renal pelvis, image 40 series 2, there is a question 1 mm stone in the distal right ureter, image 71 series 2. Right perinephric stranding is identified. There are no kidney stones bilaterally. Left parapelvic renal cysts are noted. There is no left hydronephrosis. The bladder is decompressed without gross abnormality. Stomach/Bowel: Small hiatal hernia is noted. Stomach is otherwise within normal limits. Patient status post prior appendectomy. No evidence of bowel wall thickening, distention, or inflammatory changes. Vascular/Lymphatic: Aortic atherosclerosis. No enlarged abdominal or pelvic lymph nodes. Reproductive: Uterus and bilateral adnexa are unremarkable. Other: None. Musculoskeletal: Degenerative joint changes of the spine are noted. IMPRESSION: Right hydroureteronephrosis with question 1 mm stones in the course of the right ureter as described. Aortic Atherosclerosis (ICD10-I70.0). Electronically Signed   By: Abelardo Diesel M.D.   On: 09/07/2020 08:59    Procedures Procedures   Medications Ordered in ED Medications  sodium chloride 0.9 % bolus 1,000 mL (0 mLs Intravenous  Stopped 09/07/20 0927)  fentaNYL (SUBLIMAZE) injection 50 mcg (50 mcg Intravenous Given 09/07/20 0807)  ondansetron (ZOFRAN) injection 4 mg (4 mg Intravenous Given 09/07/20 0803)  fentaNYL (SUBLIMAZE) injection 100 mcg (100 mcg Intravenous Given 09/07/20 0847)  ketorolac (TORADOL) 30 MG/ML injection 30 mg (30 mg Intravenous Given 09/07/20 9030)    ED Course  I have reviewed the triage vital signs and the nursing notes.  Pertinent labs & imaging results that were available during my care of the patient were reviewed by me and considered in my medical decision making (see chart for details).    MDM Rules/Calculators/A&P                          66 year old female with history of diabetes, appendectomy, presents with sudden onset right flank and lower abdominal pain beginning at 4 AM today.  DDx includes pancreatitis, cholecystitis, pyelonephritis, nephrolithiasis, diverticulitis, AAA, PID, ovarian torsion, and tuboovarian abscess.  UA not consistent with pyelonephritis as 0-5WBC, will send for culture.  CT stone study shows right hydroureteronephrosis with question 22m stones in the course of the right ureter.  Lipase elevated to 149 but not technically 3 times upper limit of normal and in setting of normal AST/ALT, no specific epigastric pain do not suspect pancreatitis as etiology of pain.  No sign of cholecystitis on CT.  Glucose elevated but no signs of acidosis.  Presence of right sided hydronephrosis is consistent with area of pain and symptoms.  Question 158mstones as etiology of obstruction, and paged Urology regarding this findings vs other stricture/etiology of hydro and spoke with Dr. WrJeffie Pollock Pain well controlled after medications. Will follow up as an outpatient. Given norco, flomax, zofran, recommend hydration.      Final Clinical Impression(s) / ED Diagnoses Final diagnoses:  Nephroureterolithiasis  Hydronephrosis, unspecified hydronephrosis type    Rx / DC Orders ED Discharge  Orders         Ordered    tamsulosin (FLOMAX) 0.4 MG CAPS capsule  Daily        09/07/20 0925    HYDROcodone-acetaminophen (NORCO/VICODIN) 5-325 MG tablet  Every 4 hours PRN        09/07/20 0925    ondansetron (ZOFRAN) 4 MG tablet  Every 6 hours        09/07/20 0925           ScGareth MorganMD 09/07/20 [id number]

## 2020-09-07 NOTE — ED Triage Notes (Signed)
Pt reports that she awoke at 0400 this am with lower back pain and nausea with dry heaves.

## 2020-09-07 NOTE — ED Notes (Signed)
ED Provider at bedside. 

## 2020-09-07 NOTE — ED Notes (Signed)
Patient transported to CT 

## 2020-09-08 LAB — URINE CULTURE: Culture: NO GROWTH

## 2020-09-29 IMAGING — MG DIGITAL SCREENING BILAT W/ CAD
4 series · 4 of 4 positions shown · non-contrast
Comparison: Previous exam(s).

CLINICAL DATA: Screening.

EXAM:
DIGITAL SCREENING BILATERAL MAMMOGRAM WITH CAD

[L CC]
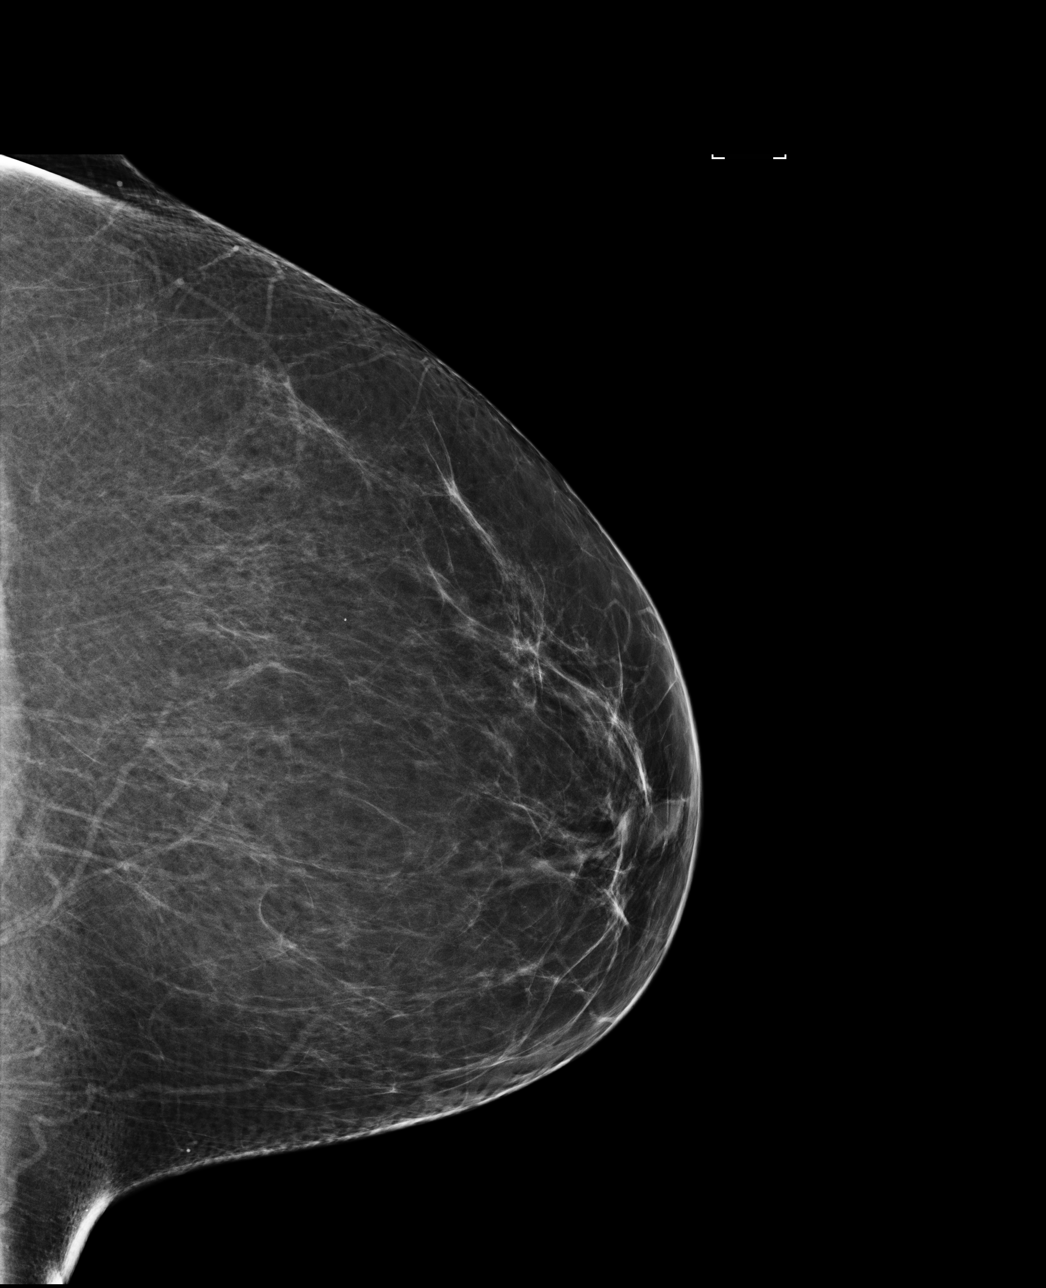

[L MLO]
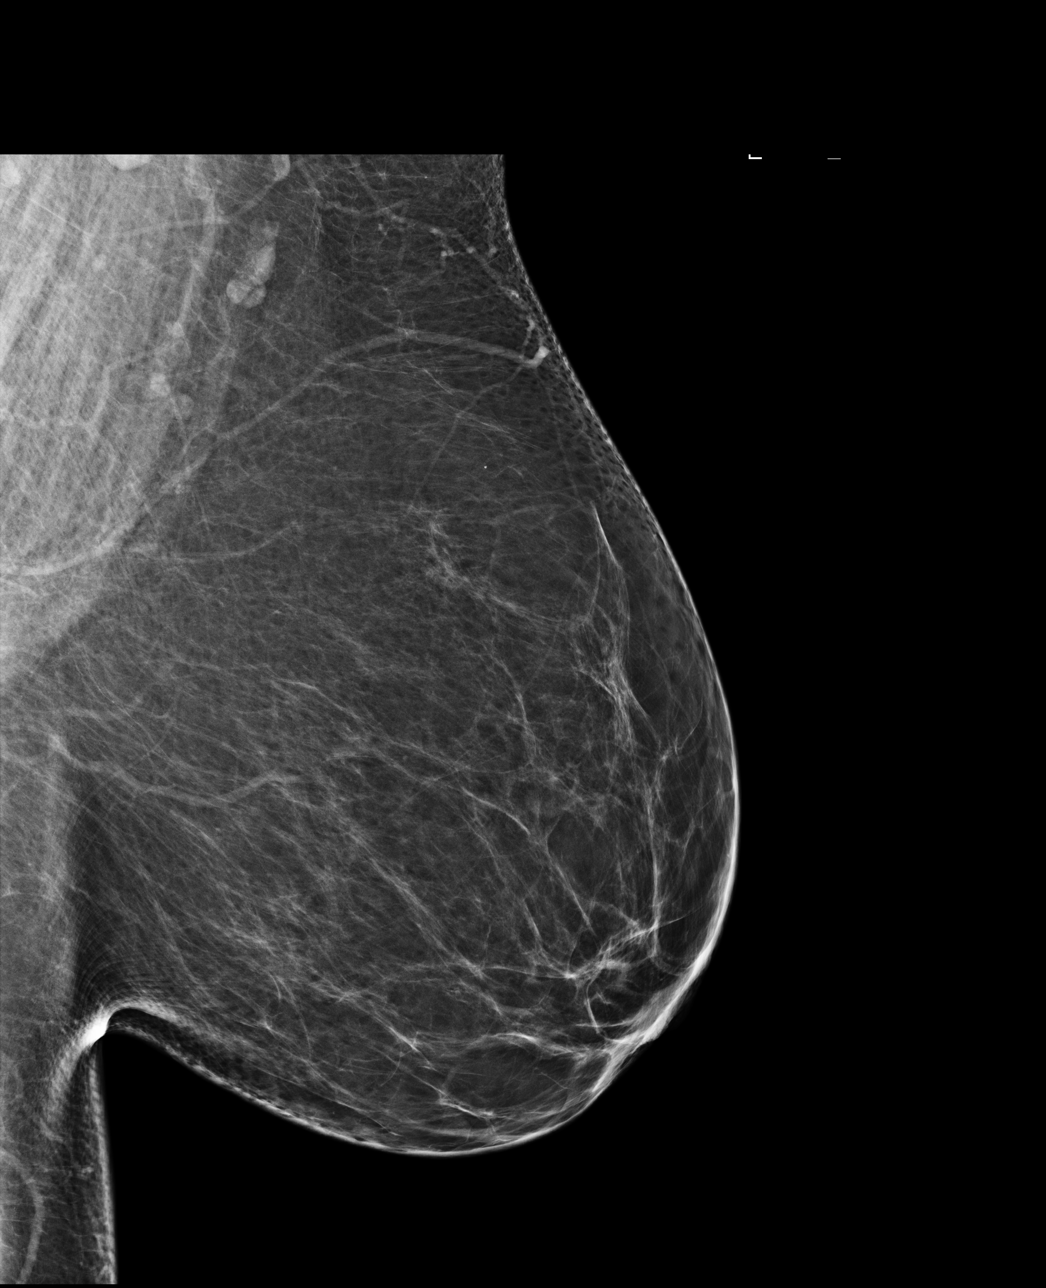

[R MLO]
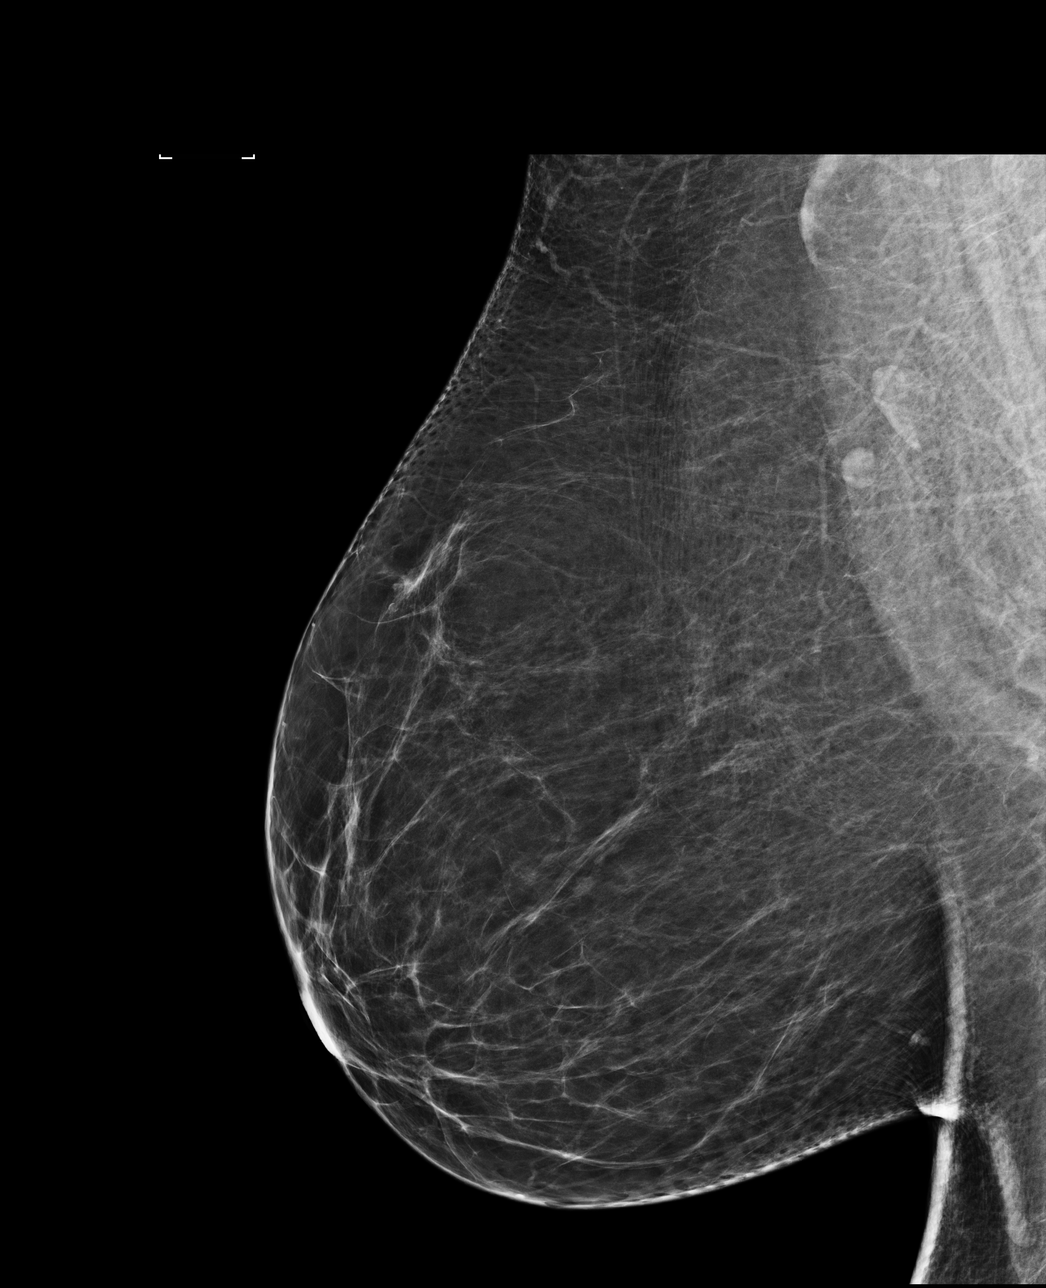

[R CC]
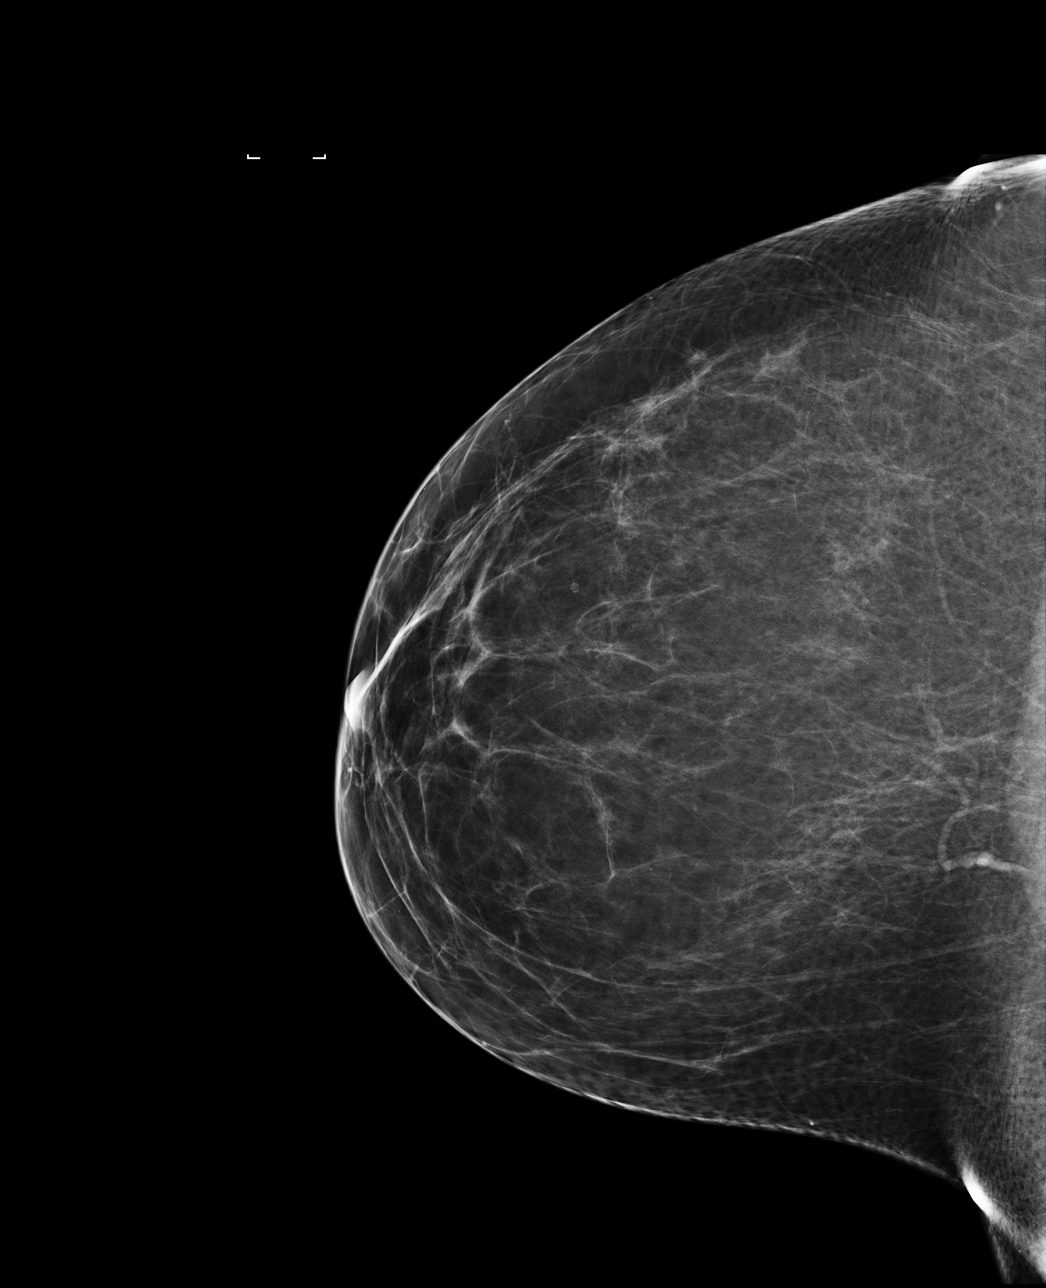

[4 of 4 positions shown; findings below may reference images not displayed]

ACR Breast Density Category b: There are scattered areas of
fibroglandular density.
FINDINGS: There are no findings suspicious for malignancy. Images were
processed with CAD.
IMPRESSION: No mammographic evidence of malignancy. A result letter of this
screening mammogram will be mailed directly to the patient.

RECOMMENDATION:
Screening mammogram in one year. (Code:AS-G-LCT)

BI-RADS CATEGORY  1: Negative.

## 2021-01-04 ENCOUNTER — Other Ambulatory Visit: Payer: Self-pay

## 2021-01-04 ENCOUNTER — Emergency Department (HOSPITAL_BASED_OUTPATIENT_CLINIC_OR_DEPARTMENT_OTHER): Payer: Medicare Other

## 2021-01-04 ENCOUNTER — Emergency Department (HOSPITAL_BASED_OUTPATIENT_CLINIC_OR_DEPARTMENT_OTHER)
Admission: EM | Admit: 2021-01-04 | Discharge: 2021-01-04 | Disposition: A | Discharge status: Home / Self Care | Payer: Medicare Other | Attending: Emergency Medicine | Admitting: Emergency Medicine

## 2021-01-04 ENCOUNTER — Encounter (HOSPITAL_BASED_OUTPATIENT_CLINIC_OR_DEPARTMENT_OTHER): Payer: Self-pay | Admitting: Emergency Medicine

## 2021-01-04 ENCOUNTER — Emergency Department (HOSPITAL_BASED_OUTPATIENT_CLINIC_OR_DEPARTMENT_OTHER): Payer: Medicare Other | Admitting: Radiology

## 2021-01-04 DIAGNOSIS — E119 Type 2 diabetes mellitus without complications: Secondary | ICD-10-CM | POA: Diagnosis not present

## 2021-01-04 DIAGNOSIS — S82142A Displaced bicondylar fracture of left tibia, initial encounter for closed fracture: Secondary | ICD-10-CM | POA: Insufficient documentation

## 2021-01-04 DIAGNOSIS — S8992XA Unspecified injury of left lower leg, initial encounter: Secondary | ICD-10-CM | POA: Diagnosis present

## 2021-01-04 DIAGNOSIS — M25562 Pain in left knee: Secondary | ICD-10-CM | POA: Diagnosis not present

## 2021-01-04 DIAGNOSIS — X509XXA Other and unspecified overexertion or strenuous movements or postures, initial encounter: Secondary | ICD-10-CM | POA: Insufficient documentation

## 2021-01-04 DIAGNOSIS — Z7984 Long term (current) use of oral hypoglycemic drugs: Secondary | ICD-10-CM | POA: Insufficient documentation

## 2021-01-04 MED ORDER — ACETAMINOPHEN 325 MG PO TABS: 650.0000 mg | ORAL_TABLET | Freq: Four times a day (QID) | ORAL | 0 refills | Status: AC | PRN

## 2021-01-04 MED ORDER — OXYCODONE HCL 5 MG PO TABS
5.0000 mg | ORAL_TABLET | Freq: Once | ORAL | Status: AC
  Administered 2021-01-04: 5 mg via ORAL
  Filled 2021-01-04 (×2): qty 1

## 2021-01-04 MED ORDER — DIPHENHYDRAMINE HCL 25 MG PO TABS: 25.0000 mg | ORAL_TABLET | Freq: Three times a day (TID) | ORAL | 0 refills | Status: AC | PRN

## 2021-01-04 MED ORDER — DIPHENHYDRAMINE HCL 25 MG PO CAPS
25.0000 mg | ORAL_CAPSULE | Freq: Once | ORAL | Status: AC
  Administered 2021-01-04: 25 mg via ORAL
  Filled 2021-01-04: qty 1

## 2021-01-04 MED ORDER — OXYCODONE HCL 5 MG PO TABS: 5.0000 mg | ORAL_TABLET | Freq: Three times a day (TID) | ORAL | 0 refills | Status: DC | PRN

## 2021-01-04 NOTE — Discharge Instructions (Addendum)
You should not put any weight on your left leg at all.  Use your crutches for getting around the house.  Please call Monday to schedule an appointment with Dr. Ave Filter in the office.  You can tell them that we spoke to Dr. Ave Filter on the phone, and that he wanted to see Joanna [name] in the office for her knee fracture this week.  You should wear the knee brace at all times.  You can take Tylenol every 6 hours for pain.  I did prescribe you some oxycodone tablets (with Benadryl to prevent itching) for severe pain.  Please be aware that this will make you drowsy.  Try to limit taking this to bedtime only.

## 2021-01-04 NOTE — ED Triage Notes (Signed)
Pt fell this am,hit her left knee. Swollen/painful

## 2021-01-04 NOTE — ED Notes (Signed)
Reviewed orders with MD related to pain medication. MD spoke to pt. And determined the best way to procede. Current orders reflect same.

## 2021-01-04 NOTE — ED Notes (Signed)
ED Provider at bedside. 

## 2021-01-04 NOTE — ED Provider Notes (Signed)
[facility] DEPT Provider Note   CSN: [id number] Arrival date & time: 01/04/21  1525     History Chief Complaint  Patient presents with   Knee Pain    Joanna [name] is a 65 y.o. female w/ hx of diabetes presenting to Ed with left knee pain.  She stepped awkwardly on a stair and felt a pop in her left knee, had pain afterwards.  Difficulty bearing weight.   No numbness or weakness.  No other injuries.  HPI     Past Medical History:  Diagnosis Date   Diabetes mellitus without complication ([place])     There are no problems to display for this patient.   Past Surgical History:  Procedure Laterality Date   APPENDECTOMY       OB History   No obstetric history on file.     No family history on file.  Social History   Tobacco Use   Smoking status: Never   Smokeless tobacco: Never  Substance Use Topics   Alcohol use: Not Currently   Drug use: Not Currently    Home Medications Prior to Admission medications   Medication Sig Start Date End Date Taking? Authorizing Provider  acetaminophen (TYLENOL) 325 MG tablet Take 2 tablets (650 mg total) by mouth every 6 (six) hours as needed for up to 30 doses for moderate pain or mild pain. 01/04/21  Yes Kesha Hurrell, Carola Rhine, MD  diphenhydrAMINE (BENADRYL) 25 MG tablet Take 1 tablet (25 mg total) by mouth every 8 (eight) hours as needed for up to 15 doses. Take with oxycodone to prevent itching 01/04/21  Yes Dameshia Seybold, Carola Rhine, MD  oxyCODONE (ROXICODONE) 5 MG immediate release tablet Take 1 tablet (5 mg total) by mouth every 8 (eight) hours as needed for up to 15 doses for severe pain. 01/04/21  Yes Wyvonnia Dusky, MD  Blood Glucose Monitoring Suppl ([place]) w/Device KIT check blood sugar 07/29/20   [provider]  Blood Glucose Monitoring Suppl ([place]) w/Device KIT CHECK BLOOD SUGAR ONCE A DAY 07/29/20   [provider]  Blood Glucose Monitoring Suppl  (ONETOUCH VERIO) w/Device KIT See admin instructions. 08/01/20   [provider]  glimepiride (AMARYL) 2 MG tablet take 2 tablets once daily  with breakfast, and 1 tab with lunch    [provider]  HYDROcodone-acetaminophen (NORCO/VICODIN) 5-325 MG tablet Take 2 tablets by mouth every 4 (four) hours as needed. 09/07/20   Gareth Morgan, MD  losartan (COZAAR) 25 MG tablet Take 0.5 tablets by mouth daily. 01/06/13   [provider]  ondansetron (ZOFRAN) 4 MG tablet Take 1 tablet (4 mg total) by mouth every 6 (six) hours. 09/07/20   Gareth Morgan, MD  simvastatin (ZOCOR) 10 MG tablet 1 tablet    [provider]  simvastatin (ZOCOR) 40 MG tablet simvastatin 40 mg tablet 10/11/13   [provider]  sitaGLIPtin (JANUVIA) 100 MG tablet 1 tablet 08/20/20   [provider]  venlafaxine (EFFEXOR) 75 MG tablet Take 1 tablet by mouth daily.    [provider]    Allergies    Lisinopril, Oxycodone-acetaminophen, and Penicillins  Review of Systems   Review of Systems  Constitutional:  Negative for chills and fever.  Respiratory:  Negative for cough and shortness of breath.   Cardiovascular:  Negative for chest pain and palpitations.  Gastrointestinal:  Negative for abdominal pain, nausea and vomiting.  Genitourinary:  Negative for dysuria and hematuria.  Musculoskeletal:  Positive for joint swelling. Negative for arthralgias and neck pain.  Skin:  Negative for color change and rash.  Neurological:  Negative for weakness and numbness.  All other systems reviewed and are negative.  Physical Exam Updated Vital Signs BP 129/61 (BP Location: Right Arm)   Pulse 81   Temp 99.1 F (37.3 C) (Oral)   Resp 16   SpO2 97%   Physical Exam Constitutional:      General: She is not in acute distress. HENT:     Head: Normocephalic and atraumatic.  Eyes:     Conjunctiva/sclera: Conjunctivae normal.     Pupils: Pupils are equal, round, and  reactive to light.  Cardiovascular:     Rate and Rhythm: Normal rate and regular rhythm.  Pulmonary:     Effort: Pulmonary effort is normal. No respiratory distress.  Musculoskeletal:     Comments: Pain, swelling of left knee  Skin:    General: Skin is warm and dry.  Neurological:     General: No focal deficit present.     Mental Status: She is alert and oriented to person, place, and time. Mental status is at baseline.     Sensory: No sensory deficit.     Motor: No weakness.  Psychiatric:        Mood and Affect: Mood normal.        Behavior: Behavior normal.    ED Results / Procedures / Treatments   Labs (all labs ordered are listed, but only abnormal results are displayed) Labs Reviewed - No data to display  EKG None  Radiology DG Tibia/Fibula Left  Result Date: 01/04/2021 CLINICAL DATA:  Left knee pain after fall. Pain extends into the lower extremity. EXAM: LEFT TIBIA AND FIBULA - 2 VIEW COMPARISON:  Left knee radiographs 08/28/2020 FINDINGS: There is some irregularity in the proximal lateral tibia concerning for lateral tibial plateau fracture. This is not clearly seen on the lateral view although there is the knee effusion. More distal tibia in is within normal limits. Fibula is unremarkable. Ankle joint is within normal limits. IMPRESSION: Probable lateral tibial plateau fracture. Recommend CT without contrast for further evaluation. These results were called by telephone at the time of interpretation on 01/04/2021 at 4:43 pm to provider Remy Dia , who verbally acknowledged these results. Electronically Signed   By: San Morelle M.D.   On: 01/04/2021 16:44   CT Knee Left Wo Contrast  Result Date: 01/04/2021 CLINICAL DATA:  Left knee pain after fall. Concern for lateral tibial plateau fracture. EXAM: CT OF THE LEFT KNEE WITHOUT CONTRAST TECHNIQUE: Multidetector CT imaging of the left knee was performed according to the standard protocol. Multiplanar CT image  reconstructions were also generated. COMPARISON:  Left tibia and fibula x-rays from same day. Left knee x-rays dated August 28, 2020. FINDINGS: Bones/Joint/Cartilage Acute minimally depressed fracture of the lateral tibial plateau. No dislocation. Mild patellofemoral compartment joint space narrowing. Tiny tricompartmental marginal osteophytes. Osteopenia. Moderate lipohemarthrosis. Ligaments Ligaments are suboptimally evaluated by CT. Muscles and Tendons Grossly intact. Soft tissue No fluid collection or hematoma.  No soft tissue mass. IMPRESSION: 1. Acute minimally depressed fracture of the lateral tibial plateau. 2. Moderate lipohemarthrosis. Electronically Signed   By: Titus Dubin M.D.   On: 01/04/2021 17:43    Procedures Procedures   Medications Ordered in ED Medications  oxyCODONE (Oxy IR/ROXICODONE) immediate release tablet 5 mg (5 mg Oral Given 01/04/21 1909)  diphenhydrAMINE (BENADRYL) capsule 25 mg (25 mg Oral Given 01/04/21  [email or web address])    ED Course  I have reviewed the triage vital signs and the nursing notes.  Pertinent labs & imaging results that were available during my care of the patient were reviewed by me and considered in my medical decision making (see chart for details).  Patient is here with left knee injury.  No other injuries noted on exam.  X-ray and CT notable for a minimally displaced left lateral tibial plateau fracture.  Patient given pain medicine (oxycodone with Benadryl she does have mild itching reactions with oxycodone).  She is not allowed to have NSAIDs.  *  I discussed the case with Dr Tamera Punt by phone from [place].  He reviewed the CT imaging.  He reports the patient can be NWB in a knee immobilizer and can f/u in the office this week.  No immediate indication for surgery at this time.  *  We ambulated the patient with crutches.  She'll be discharged home with her husband.  Ortho f/u number provided and instructions given.     Final Clinical  Impression(s) / ED Diagnoses Final diagnoses:  Closed fracture of left tibial plateau, initial encounter    Rx / DC Orders ED Discharge Orders          Ordered    acetaminophen (TYLENOL) 325 MG tablet  Every 6 hours PRN        01/04/21 1944    oxyCODONE (ROXICODONE) 5 MG immediate release tablet  Every 8 hours PRN        01/04/21 1944    diphenhydrAMINE (BENADRYL) 25 MG tablet  Every 8 hours PRN        01/04/21 1944             Wyvonnia Dusky, MD 01/05/21 1112

## 2021-01-04 NOTE — ED Notes (Signed)
Ice bag given to Pt

## 2021-08-19 ENCOUNTER — Other Ambulatory Visit: Payer: Self-pay | Admitting: Family Medicine

## 2021-08-19 DIAGNOSIS — Z1231 Encounter for screening mammogram for malignant neoplasm of breast: Secondary | ICD-10-CM

## 2021-09-05 ENCOUNTER — Other Ambulatory Visit: Payer: Self-pay | Admitting: Family Medicine

## 2021-09-05 ENCOUNTER — Ambulatory Visit
Admission: RE | Admit: 2021-09-05 | Discharge: 2021-09-05 | Disposition: A | Discharge status: Home / Self Care | Payer: Medicare Other | Source: Ambulatory Visit | Attending: Family Medicine | Admitting: Family Medicine

## 2021-09-05 DIAGNOSIS — Z1231 Encounter for screening mammogram for malignant neoplasm of breast: Secondary | ICD-10-CM

## 2022-01-20 HISTORY — PX: OTHER SURGICAL HISTORY: SHX169

## 2022-04-24 ENCOUNTER — Other Ambulatory Visit: Payer: Self-pay | Admitting: Family Medicine

## 2022-04-24 DIAGNOSIS — E2839 Other primary ovarian failure: Secondary | ICD-10-CM

## 2022-06-03 ENCOUNTER — Other Ambulatory Visit: Payer: Self-pay

## 2022-06-03 DIAGNOSIS — Z87891 Personal history of nicotine dependence: Secondary | ICD-10-CM

## 2022-06-03 DIAGNOSIS — Z122 Encounter for screening for malignant neoplasm of respiratory organs: Secondary | ICD-10-CM

## 2022-07-20 NOTE — Patient Instructions (Signed)
Thank you for participating in the [facility] Screening Program. It was our pleasure to meet you today. We will call you with the results of your scan within the next few days. Your scan will be assigned a Lung RADS category score by the physicians reading the scans.  This Lung RADS score determines follow up scanning.  See below for description of categories, and follow up screening recommendations. We will be in touch to schedule your follow up screening annually or based on recommendations of our providers. We will fax a copy of your scan results to your Primary Care Physician, or the physician who referred you to the program, to ensure they have the results. Please call the office if you have any questions or concerns regarding your scanning experience or results.  Our office number is [phone]. Please speak with Denise Phelps, RN. , or  Denise Buckner RN, They are  our Lung Cancer Screening RN.'s If They are unavailable when you call, Please leave a message on the voice mail. We will return your call at our earliest convenience.This voice mail is monitored several times a day.  Remember, if your scan is normal, we will scan you annually as long as you continue to meet the criteria for the program. (Age 50-80, Current smoker or smoker who has quit within the last 15 years). If you are a smoker, remember, quitting is the single most powerful action that you can take to decrease your risk of lung cancer and other pulmonary, breathing related problems. We know quitting is hard, and we are here to help.  Please let us know if there is anything we can do to help you meet your goal of quitting. If you are a former smoker, congratulations. We are proud of you! Remain smoke free! Remember you can refer friends or family members through the number above.  We will screen them to make sure they meet criteria for the program. Thank you for helping us take better care of you by  participating in Lung Screening.  You can receive free nicotine replacement therapy ( patches, gum or mints) by calling 1-800-QUIT NOW. Please call so we can get you on the path to becoming  a non-smoker. I know it is hard, but you can do this!  Lung RADS Categories:  Lung RADS 1: no nodules or definitely non-concerning nodules.  Recommendation is for a repeat annual scan in 12 months.  Lung RADS 2:  nodules that are non-concerning in appearance and behavior with a very low likelihood of becoming an active cancer. Recommendation is for a repeat annual scan in 12 months.  Lung RADS 3: nodules that are probably non-concerning , includes nodules with a low likelihood of becoming an active cancer.  Recommendation is for a 6-month repeat screening scan. Often noted after an upper respiratory illness. We will be in touch to make sure you have no questions, and to schedule your 6-month scan.  Lung RADS 4 A: nodules with concerning findings, recommendation is most often for a follow up scan in 3 months or additional testing based on our provider's assessment of the scan. We will be in touch to make sure you have no questions and to schedule the recommended 3 month follow up scan.  Lung RADS 4 B:  indicates findings that are concerning. We will be in touch with you to schedule additional diagnostic testing based on our provider's  assessment of the scan.  Other options for assistance in smoking cessation (   As covered by your insurance benefits)  Hypnosis for smoking cessation  [employer] [phone]  Acupuncture for smoking cessation  [facility] [phone]   

## 2022-07-20 NOTE — Progress Notes (Unsigned)
Virtual Visit via Telephone Note  I connected with Joanna [name] on 07/20/22 at  8:30 AM EST by telephone and verified that I am speaking with the correct person using two identifiers.  Location: Patient: Home Provider: Office   I discussed the limitations, risks, security and privacy concerns of performing an evaluation and management service by telephone and the availability of in person appointments. I also discussed with the patient that there may be a patient responsible charge related to this service. The patient expressed understanding and agreed to proceed.    Shared Decision Making Visit Lung Cancer Screening Program [phone])   Eligibility: Age 67 y.o. Pack Years Smoking History Calculation 25 (# packs/per year x # years smoked) Recent History of coughing up blood  no Unexplained weight loss? no ( >Than 15 pounds within the last 6 months ) Prior History Lung / other cancer no (Diagnosis within the last 5 years already requiring surveillance chest CT Scans). Smoking Status Former Smoker Former Smokers: Years since quit: 6 years  Quit Date: 2018  Visit Components: Discussion included one or more decision making aids. yes Discussion included risk/benefits of screening. yes Discussion included potential follow up diagnostic testing for abnormal scans. yes Discussion included meaning and risk of over diagnosis. yes Discussion included meaning and risk of False Positives. yes Discussion included meaning of total radiation exposure. yes  Counseling Included: Importance of adherence to annual lung cancer LDCT screening. yes Impact of comorbidities on ability to participate in the program. yes Ability and willingness to under diagnostic treatment. yes  Smoking Cessation Counseling: Current Smokers:  Discussed importance of smoking cessation. yes Information about tobacco cessation classes and interventions provided to patient. yes Patient provided with "ticket" for LDCT  Scan. NA Symptomatic Patient. no  Counseling(Intermediate counseling: > three minutes) 99406 Diagnosis Code: Tobacco Use Z72.0 Asymptomatic Patient yes  Counseling (Intermediate counseling: > three minutes counseling) [id number] Former Smokers:  Discussed the importance of maintaining cigarette abstinence. yes Diagnosis Code: Personal History of Nicotine Dependence. Z16.967 Information about tobacco cessation classes and interventions provided to patient. Yes Patient provided with "ticket" for LDCT Scan. Na Written Order for Lung Cancer Screening with LDCT placed in Epic. Yes (CT Chest Lung Cancer Screening Low Dose W/O CM) [id number] Z12.2-Screening of respiratory organs Z87.891-Personal history of nicotine dependence  I have spent 25 minutes of face to face/ virtual visit   time with Joanna [name] discussing the risks and benefits of lung cancer screening. We viewed / discussed a power point together that explained in detail the above noted topics. We paused at intervals to allow for questions to be asked and answered to ensure understanding.We discussed that the single most powerful action that she can take to decrease her risk of developing lung cancer is to quit smoking. We discussed whether or not she is ready to commit to setting a quit date. We discussed options for tools to aid in quitting smoking including nicotine replacement therapy, non-nicotine medications, support groups, Quit Smart classes, and behavior modification. We discussed that often times setting smaller, more achievable goals, such as eliminating 1 cigarette a day for a week and then 2 cigarettes a day for a week can be helpful in slowly decreasing the number of cigarettes smoked. This allows for a sense of accomplishment as well as providing a clinical benefit. I provided  her  with smoking cessation  information  with contact information for community resources, classes, free nicotine replacement therapy, and access to mobile apps, text  messaging, and on-line smoking cessation help. I have also provided her  the office contact information in the event she needs to contact me, or the screening staff. We discussed the time and location of the scan, and that either Joanna Glassman RN, Joanna Prince, RN  or I will call / send a letter with the results within 24-72 hours of receiving them. The patient verbalized understanding of all of  the above and had no further questions upon leaving the office. They have my contact information in the event they have any further questions.  I spent 3-5 minutes counseling on smoking cessation and the health risks of continued tobacco abuse.  I explained to the patient that there has been a high incidence of coronary artery disease noted on these exams. I explained that this is a non-gated exam therefore degree or severity cannot be determined. This patient is on statin therapy. I have asked the patient to follow-up with their PCP regarding any incidental finding of coronary artery disease and management with diet or medication as their PCP  feels is clinically indicated. The patient verbalized understanding of the above and had no further questions upon completion of the visit.     Martyn Ehrich, NP

## 2022-07-21 ENCOUNTER — Ambulatory Visit (INDEPENDENT_AMBULATORY_CARE_PROVIDER_SITE_OTHER): Payer: Medicare Other | Admitting: Primary Care

## 2022-07-21 VITALS — Ht 66.0 in | Wt 188.0 lb

## 2022-07-21 DIAGNOSIS — Z87891 Personal history of nicotine dependence: Secondary | ICD-10-CM | POA: Diagnosis not present

## 2022-07-28 ENCOUNTER — Ambulatory Visit (HOSPITAL_BASED_OUTPATIENT_CLINIC_OR_DEPARTMENT_OTHER)
Admission: RE | Admit: 2022-07-28 | Discharge: 2022-07-28 | Disposition: A | Discharge status: Home / Self Care | Payer: Medicare Other | Source: Ambulatory Visit | Attending: Family Medicine | Admitting: Family Medicine

## 2022-07-28 ENCOUNTER — Other Ambulatory Visit: Payer: Self-pay

## 2022-07-28 DIAGNOSIS — Z122 Encounter for screening for malignant neoplasm of respiratory organs: Secondary | ICD-10-CM

## 2022-07-28 DIAGNOSIS — Z87891 Personal history of nicotine dependence: Secondary | ICD-10-CM | POA: Insufficient documentation

## 2022-10-21 ENCOUNTER — Ambulatory Visit
Admission: RE | Admit: 2022-10-21 | Discharge: 2022-10-21 | Disposition: A | Discharge status: Home / Self Care | Payer: Medicare Other | Source: Ambulatory Visit | Attending: Family Medicine | Admitting: Family Medicine

## 2022-10-21 DIAGNOSIS — E2839 Other primary ovarian failure: Secondary | ICD-10-CM

## 2022-11-17 ENCOUNTER — Other Ambulatory Visit: Payer: Self-pay | Admitting: Urology

## 2022-11-17 ENCOUNTER — Encounter (HOSPITAL_BASED_OUTPATIENT_CLINIC_OR_DEPARTMENT_OTHER): Payer: Self-pay | Admitting: Urology

## 2022-11-17 NOTE — Progress Notes (Signed)
11/17/2022 2:43 PM Spoke w/ via phone for pre-op interview---patient Lab needs dos----   I Stat, EKG Lab results------none COVID test -----patient states asymptomatic no test needed Arrive at -------0915 NPO after MN NO Solid Food.  Clear liquids from MN until---0715 Med rec completed Medications to take morning of surgery -----none Diabetic medication -----HOLD oral agents (Amaryl and Metformin) DOS, Hold Losartan DOS HOLD Ozempic 11/29/22 Patient instructed no nail polish to be worn day of surgery Patient instructed to bring photo id and insurance card day of surgery Patient aware to have Driver (ride ) / caregiver    for 24 hours after surgery -Pt. husband Patient Special Instructions -----none Pre-Op special Instructions ----none- Patient verbalized understanding of instructions that were given at this phone interview. Patient denies shortness of breath, chest pain, fever, cough at this phone interview. No orders in EPIC, IB message sent to Dr. Cardell Peach.   Bralen Wiltgen, Blanchard Kelch

## 2022-12-04 ENCOUNTER — Ambulatory Visit (HOSPITAL_BASED_OUTPATIENT_CLINIC_OR_DEPARTMENT_OTHER): Payer: Medicare Other | Admitting: Certified Registered Nurse Anesthetist

## 2022-12-04 ENCOUNTER — Encounter (HOSPITAL_BASED_OUTPATIENT_CLINIC_OR_DEPARTMENT_OTHER): Payer: Self-pay | Admitting: Urology

## 2022-12-04 ENCOUNTER — Other Ambulatory Visit: Payer: Self-pay

## 2022-12-04 ENCOUNTER — Ambulatory Visit (HOSPITAL_BASED_OUTPATIENT_CLINIC_OR_DEPARTMENT_OTHER)
Admission: RE | Admit: 2022-12-04 | Discharge: 2022-12-04 | Disposition: A | Discharge status: Home / Self Care | Payer: Medicare Other | Attending: Urology | Admitting: Urology

## 2022-12-04 ENCOUNTER — Encounter (HOSPITAL_BASED_OUTPATIENT_CLINIC_OR_DEPARTMENT_OTHER)
Admission: RE | Disposition: A | Discharge status: Home / Self Care | Payer: Self-pay | Source: Home / Self Care | Attending: Urology

## 2022-12-04 DIAGNOSIS — Z7984 Long term (current) use of oral hypoglycemic drugs: Secondary | ICD-10-CM | POA: Insufficient documentation

## 2022-12-04 DIAGNOSIS — M1990 Unspecified osteoarthritis, unspecified site: Secondary | ICD-10-CM | POA: Diagnosis not present

## 2022-12-04 DIAGNOSIS — J449 Chronic obstructive pulmonary disease, unspecified: Secondary | ICD-10-CM | POA: Diagnosis not present

## 2022-12-04 DIAGNOSIS — Z87891 Personal history of nicotine dependence: Secondary | ICD-10-CM

## 2022-12-04 DIAGNOSIS — E119 Type 2 diabetes mellitus without complications: Secondary | ICD-10-CM | POA: Insufficient documentation

## 2022-12-04 DIAGNOSIS — N200 Calculus of kidney: Secondary | ICD-10-CM | POA: Insufficient documentation

## 2022-12-04 DIAGNOSIS — Z09 Encounter for follow-up examination after completed treatment for conditions other than malignant neoplasm: Secondary | ICD-10-CM | POA: Insufficient documentation

## 2022-12-04 DIAGNOSIS — F419 Anxiety disorder, unspecified: Secondary | ICD-10-CM | POA: Insufficient documentation

## 2022-12-04 DIAGNOSIS — Z7985 Long-term (current) use of injectable non-insulin antidiabetic drugs: Secondary | ICD-10-CM | POA: Insufficient documentation

## 2022-12-04 HISTORY — DX: Anxiety disorder, unspecified: F41.9

## 2022-12-04 HISTORY — DX: Emphysema, unspecified: J43.9

## 2022-12-04 HISTORY — DX: Personal history of urinary calculi: Z87.442

## 2022-12-04 HISTORY — PX: CYSTOSCOPY/URETEROSCOPY/HOLMIUM LASER/STENT PLACEMENT: SHX6546

## 2022-12-04 HISTORY — DX: Unspecified osteoarthritis, unspecified site: M19.90

## 2022-12-04 HISTORY — DX: Sleep apnea, unspecified: G47.30

## 2022-12-04 HISTORY — DX: Chronic obstructive pulmonary disease, unspecified: J44.9

## 2022-12-04 LAB — POCT I-STAT, CHEM 8
BUN: 11 mg/dL (ref 8–23)
Calcium, Ion: 1.23 mmol/L (ref 1.15–1.40)
Chloride: 106 mmol/L (ref 98–111)
Creatinine, Ser: 0.7 mg/dL (ref 0.44–1.00)
Glucose, Bld: 98 mg/dL (ref 70–99)
HCT: 31 % — ABNORMAL LOW (ref 36.0–46.0)
Hemoglobin: 10.5 g/dL — ABNORMAL LOW (ref 12.0–15.0)
Potassium: 3.9 mmol/L (ref 3.5–5.1)
Sodium: 143 mmol/L (ref 135–145)
TCO2: 28 mmol/L (ref 22–32)

## 2022-12-04 LAB — GLUCOSE, CAPILLARY: Glucose-Capillary: 104 mg/dL — ABNORMAL HIGH (ref 70–99)

## 2022-12-04 SURGERY — CYSTOSCOPY/URETEROSCOPY/HOLMIUM LASER/STENT PLACEMENT
Anesthesia: General | Site: Renal | Laterality: Left

## 2022-12-04 MED ORDER — CIPROFLOXACIN IN D5W 400 MG/200ML IV SOLN
INTRAVENOUS | Status: AC
  Filled 2022-12-04: qty 200

## 2022-12-04 MED ORDER — OXYCODONE HCL 5 MG PO TABS: 5.0000 mg | ORAL_TABLET | Freq: Three times a day (TID) | ORAL | 0 refills | Status: AC | PRN

## 2022-12-04 MED ORDER — LIDOCAINE 2% (20 MG/ML) 5 ML SYRINGE
INTRAMUSCULAR | Status: DC | PRN
  Administered 2022-12-04: 50 mg via INTRAVENOUS

## 2022-12-04 MED ORDER — SODIUM CHLORIDE 0.9 % IR SOLN
Status: DC | PRN
  Administered 2022-12-04: 3000 mL

## 2022-12-04 MED ORDER — FENTANYL CITRATE (PF) 100 MCG/2ML IJ SOLN
INTRAMUSCULAR | Status: AC
  Filled 2022-12-04: qty 2

## 2022-12-04 MED ORDER — ACETAMINOPHEN 10 MG/ML IV SOLN: 1000.0000 mg | Freq: Once | INTRAVENOUS | Status: DC | PRN

## 2022-12-04 MED ORDER — HYDROCODONE-ACETAMINOPHEN 7.5-325 MG PO TABS
ORAL_TABLET | ORAL | Status: AC
  Filled 2022-12-04: qty 1

## 2022-12-04 MED ORDER — MIDAZOLAM HCL 2 MG/2ML IJ SOLN
INTRAMUSCULAR | Status: DC | PRN
  Administered 2022-12-04: 2 mg via INTRAVENOUS

## 2022-12-04 MED ORDER — MIDAZOLAM HCL 2 MG/2ML IJ SOLN
INTRAMUSCULAR | Status: AC
  Filled 2022-12-04: qty 2

## 2022-12-04 MED ORDER — HYDROCODONE-ACETAMINOPHEN 7.5-325 MG PO TABS
1.0000 | ORAL_TABLET | Freq: Once | ORAL | Status: AC | PRN
  Administered 2022-12-04: 1 via ORAL

## 2022-12-04 MED ORDER — ONDANSETRON HCL 4 MG/2ML IJ SOLN
INTRAMUSCULAR | Status: DC | PRN
  Administered 2022-12-04: 4 mg via INTRAVENOUS

## 2022-12-04 MED ORDER — FENTANYL CITRATE (PF) 250 MCG/5ML IJ SOLN
INTRAMUSCULAR | Status: DC | PRN
  Administered 2022-12-04 (×2): 50 ug via INTRAVENOUS

## 2022-12-04 MED ORDER — HYDROCODONE-ACETAMINOPHEN 5-325 MG PO TABS: 1.0000 | ORAL_TABLET | ORAL | 0 refills | Status: DC | PRN

## 2022-12-04 MED ORDER — IOHEXOL 300 MG/ML  SOLN
INTRAMUSCULAR | Status: DC | PRN
  Administered 2022-12-04: 4 mL

## 2022-12-04 MED ORDER — DEXAMETHASONE SODIUM PHOSPHATE 10 MG/ML IJ SOLN
INTRAMUSCULAR | Status: DC | PRN
  Administered 2022-12-04: 5 mg via INTRAVENOUS

## 2022-12-04 MED ORDER — FENTANYL CITRATE (PF) 100 MCG/2ML IJ SOLN: 25.0000 ug | INTRAMUSCULAR | Status: DC | PRN

## 2022-12-04 MED ORDER — PROPOFOL 10 MG/ML IV BOLUS
INTRAVENOUS | Status: DC | PRN
  Administered 2022-12-04: 150 mg via INTRAVENOUS

## 2022-12-04 MED ORDER — CIPROFLOXACIN IN D5W 400 MG/200ML IV SOLN
400.0000 mg | INTRAVENOUS | Status: AC
  Administered 2022-12-04: 400 mg via INTRAVENOUS

## 2022-12-04 MED ORDER — LACTATED RINGERS IV SOLN: INTRAVENOUS | Status: DC

## 2022-12-04 SURGICAL SUPPLY — 28 items
BAG DRAIN URO-CYSTO SKYTR STRL (DRAIN) ×1 IMPLANT
BAG DRN UROCATH (DRAIN) ×1
BASKET ZERO TIP NITINOL 2.4FR (BASKET) IMPLANT
BSKT STON RTRVL ZERO TP 2.4FR (BASKET)
CATH SET URETHRAL DILATOR (CATHETERS) IMPLANT
CATH URETERAL DUAL LUMEN 10F (MISCELLANEOUS) IMPLANT
CATH URETL OPEN 5X70 (CATHETERS) ×1 IMPLANT
CLOTH BEACON ORANGE TIMEOUT ST (SAFETY) ×1 IMPLANT
GLOVE BIO SURGEON STRL SZ7 (GLOVE) ×1 IMPLANT
GOWN STRL REUS W/TWL LRG LVL3 (GOWN DISPOSABLE) ×1 IMPLANT
GUIDEWIRE STR DUAL SENSOR (WIRE) ×1 IMPLANT
GUIDEWIRE ZIPWRE .038 STRAIGHT (WIRE) IMPLANT
IV NS 1000ML (IV SOLUTION) ×1
IV NS 1000ML BAXH (IV SOLUTION) ×1 IMPLANT
IV NS IRRIG 3000ML ARTHROMATIC (IV SOLUTION) ×1 IMPLANT
KIT TURNOVER CYSTO (KITS) ×1 IMPLANT
LASER FIB FLEXIVA PULSE ID 365 (Laser) IMPLANT
MANIFOLD NEPTUNE II (INSTRUMENTS) ×1 IMPLANT
NS IRRIG 500ML POUR BTL (IV SOLUTION) ×1 IMPLANT
PACK CYSTO (CUSTOM PROCEDURE TRAY) ×1 IMPLANT
SHEATH NAVIGATOR HD 11/13X36 (SHEATH) IMPLANT
SLEEVE SCD COMPRESS KNEE MED (STOCKING) ×1 IMPLANT
STENT CONTOUR 7FRX24 (STENTS) IMPLANT
SYR 10ML LL (SYRINGE) ×1 IMPLANT
TRACTIP FLEXIVA PULS ID 200XHI (Laser) IMPLANT
TRACTIP FLEXIVA PULSE ID 200 (Laser) ×1
TUBE CONNECTING 12X1/4 (SUCTIONS) ×1 IMPLANT
TUBING UROLOGY SET (TUBING) ×1 IMPLANT

## 2022-12-04 NOTE — Anesthesia Procedure Notes (Signed)
Procedure Name: LMA Insertion Date/Time: 12/04/2022 12:18 PM  Performed by: Dairl Ponder, CRNAPre-anesthesia Checklist: Patient identified, Emergency Drugs available, Suction available and Patient being monitored Patient Re-evaluated:Patient Re-evaluated prior to induction Oxygen Delivery Method: Circle System Utilized Preoxygenation: Pre-oxygenation with 100% oxygen Induction Type: IV induction Ventilation: Mask ventilation without difficulty LMA: LMA inserted LMA Size: 4.0 Number of attempts: 1 Airway Equipment and Method: Bite block Placement Confirmation: positive ETCO2 Tube secured with: Tape Dental Injury: Teeth and Oropharynx as per pre-operative assessment

## 2022-12-04 NOTE — Discharge Instructions (Addendum)
[facility] [phone] Post Ureteroscopy With or Without Stent Instructions  Definitions:  Ureter: The duct that transports urine from the kidney to the bladder. Stent:   A plastic hollow tube that is placed into the ureter, from the kidney to the bladder to prevent the ureter from swelling shut.  GENERAL INSTRUCTIONS:  Despite the fact that no skin incisions were used, the area around the ureter and bladder is raw and irritated. The stent is a foreign body which will further irritate the bladder wall. This irritation is manifested by increased frequency of urination, both day and night, and by an increase in the urge to urinate. In some, the urge to urinate is present almost always. Sometimes the urge is strong enough that you may not be able to stop yourself from urinating. The only real cure is to remove the stent and then give time for the bladder wall to heal which can't be done until the danger of the ureter swelling shut has passed, which varies.  You may see some blood in your urine while the stent is in place and a few days afterwards. Do not be alarmed, even if the urine was clear for a while. Get off your feet and drink lots of fluids until clearing occurs. If you start to pass clots or don't improve, call us.  DIET: You may return to your normal diet immediately. Because of the raw surface of your bladder, alcohol, spicy foods, acid type foods and drinks with caffeine may cause irritation or frequency and should be used in moderation. To keep your urine flowing freely and to avoid constipation, drink plenty of fluids during the day ( 8-10 glasses ). Tip: Avoid cranberry juice because it is very acidic.  ACTIVITY: Your physical activity doesn't need to be restricted. However, if you are very active, you may see some blood in your urine. We suggest that you reduce your activity under these circumstances until the bleeding has stopped.  BOWELS: It is important to  keep your bowels regular during the postoperative period. Straining with bowel movements can cause bleeding. A bowel movement every other day is reasonable. Use a mild laxative if needed, such as Milk of Magnesia 2-3 tablespoons, or 2 Dulcolax tablets. Call if you continue to have problems. If you have been taking narcotics for pain, before, during or after your surgery, you may be constipated. Take a laxative if necessary.   MEDICATION: You should resume your pre-surgery medications unless told not to. In addition you will often be given an antibiotic to prevent infection. These should be taken as prescribed until the bottles are finished unless you are having an unusual reaction to one of the drugs.  PROBLEMS YOU SHOULD REPORT TO [place]: Fevers over 100.5 Fahrenheit. Heavy bleeding, or clots ( See above notes about blood in urine ). Inability to urinate. Drug reactions ( hives, rash, nausea, vomiting, diarrhea ). Severe burning or pain with urination that is not improving.  FOLLOW-UP: You will need a follow-up appointment to monitor your progress. Call for this appointment at the number listed above. Usually the first appointment will be about three to fourteen days after your surgery.  You will keep stent in place until second stage ureteroscopy.  Post Anesthesia Home Care Instructions  Activity: Get plenty of rest for the remainder of the day. A responsible individual must stay with you for 24 hours following the procedure.  For the next 24 hours, DO NOT: -Drive a car -Advertising copywriter -Drink alcoholic  beverages -Take any medication unless instructed by your physician -Make any legal decisions or sign important papers.  Meals: Start with liquid foods such as gelatin or soup. Progress to regular foods as tolerated. Avoid greasy, spicy, heavy foods. If nausea and/or vomiting occur, drink only clear liquids until the nausea and/or vomiting subsides. Call your physician if vomiting  continues.  Special Instructions/Symptoms: Your throat may feel dry or sore from the anesthesia or the breathing tube placed in your throat during surgery. If this causes discomfort, gargle with warm salt water. The discomfort should disappear within 24 hours.

## 2022-12-04 NOTE — Transfer of Care (Signed)
Immediate Anesthesia Transfer of Care Note  Patient: Joanna [name]  Procedure(s) Performed: CYSTOSCOPY/LEFT RETROGRADE PYELOGRAM/LEFT URETEROSCOPY/HOLMIUM LASER/LEFT STENT PLACEMENT (Left: Renal)  Patient Location: PACU  Anesthesia Type:General  Level of Consciousness: drowsy and patient cooperative  Airway & Oxygen Therapy: Patient Spontanous Breathing  Post-op Assessment: Report given to RN and Post -op Vital signs reviewed and stable  Post vital signs: Reviewed and stable  Last Vitals:  Vitals Value Taken Time  BP 141/64 12/04/22 1347  Temp    Pulse 78 12/04/22 1349  Resp 23 12/04/22 1349  SpO2 96 % 12/04/22 1349  Vitals shown include unvalidated device data.  Last Pain:  Vitals:   12/04/22 0956  TempSrc: Oral  PainSc: 1       Patients Stated Pain Goal: 5 (12/04/22 0956)  Complications: No notable events documented.

## 2022-12-04 NOTE — Anesthesia Preprocedure Evaluation (Signed)
Anesthesia Evaluation  Patient identified by MRN, date of birth, ID band Patient awake    Reviewed: Allergy & Precautions, NPO status , Patient's Chart, lab work & pertinent test results  History of Anesthesia Complications Negative for: history of anesthetic complications  Airway Mallampati: III  TM Distance: >3 FB Neck ROM: Full    Dental  (+) Dental Advisory Given   Pulmonary sleep apnea , COPD, former smoker   breath sounds clear to auscultation       Cardiovascular negative cardio ROS  Rhythm:Regular     Neuro/Psych   Anxiety        GI/Hepatic negative GI ROS, Neg liver ROS,,,  Endo/Other  diabetes    Renal/GU negative Renal ROS     Musculoskeletal  (+) Arthritis ,    Abdominal   Peds  Hematology negative hematology ROS (+)   Anesthesia Other Findings   Reproductive/Obstetrics                             Anesthesia Physical Anesthesia Plan  ASA: 2  Anesthesia Plan: General   Post-op Pain Management:    Induction: Intravenous  PONV Risk Score and Plan: 3 and Ondansetron and Dexamethasone  Airway Management Planned: Oral ETT and LMA  Additional Equipment: None  Intra-op Plan:   Post-operative Plan: Extubation in OR  Informed Consent: I have reviewed the patients History and Physical, chart, labs and discussed the procedure including the risks, benefits and alternatives for the proposed anesthesia with the patient or authorized representative who has indicated his/her understanding and acceptance.     Dental advisory given  Plan Discussed with: CRNA  Anesthesia Plan Comments:        Anesthesia Quick Evaluation

## 2022-12-04 NOTE — Op Note (Signed)
Operative Note  Preoperative diagnosis:  1.  Left renal pelvis stone  Postoperative diagnosis: 1.  Left renal pelvis stone  Procedure(s): 1.  Cystoscopy 2.  Left ureteroscopy with laser lithotripsy and basket extraction of stones 3.  Left retrograde pyelogram 4.  Left ureteral stent placement 5. Fluoroscopy with intraoperative interpretation  Surgeon: Jettie Pagan, MD  Assistants:  None  Anesthesia:  General  Complications:  None  EBL:  Minimal  Specimens: 1. None  Drains/Catheters: 1.  Left 6Fr x 26cm ureteral stent without tether string  Intraoperative findings:   Cystoscopy demonstrated no suspicious bladder lesions. Left retrograde pyelogram with no hydronephrosis and 1.5 cm filling defect in the left renal pelvis consistent with left renal pelvis stone. Left ureteroscopy demonstrated left ureteral stenosis as well as left mid ureteral narrowing at the level of the iliac vessels that would not accommodate an access sheath beyond this point as this led to buckling of the access sheath.  Ureteroscopy revealed over 1.5 cm left renal pelvis stone.  50% of the stone was dusted and the remaining 50% stone was fragmented.  We were unable to basket extract the stones given the ureteral narrowing.  We elected to leave stent in place to allow for passive dilation and bring her back in couple weeks for second stage ureteroscopy. Successful stent placement.  Indication:  Joanna [name] is a 67 y.o. female with large left renal pelvis stone here for for stage ureteroscopy with laser lithotripsy and basket traction of stone.  Description of procedure: After informed consent was obtained from the patient, the patient was identified and taken to the operating room and placed in the supine position.  General anesthesia was administered as well as perioperative IV antibiotics.  At the beginning of the case, a time-out was performed to properly identify the patient, the surgery to be performed,  and the surgical site.  Sequential compression devices were applied to the lower extremities at the beginning of the case for DVT prophylaxis.  The patient was then placed in the dorsal lithotomy supine position, prepped and draped in sterile fashion.  Preliminary scout fluoroscopy revealed that the stone was not radiopaque. We then passed the 21-French rigid cystoscope through the urethra and into the bladder under vision without any difficulty, noting a normal urethra without strictures.  A systematic evaluation of the bladder revealed no evidence of any suspicious bladder lesions.  Ureteral orifices were in normal position.    Under cystoscopic and flouroscopic guidance, we cannulated the left ureteral orifice with a 5-French open-ended ureteral catheter and a gentle retrograde pyelogram was performed, revealing a normal caliber ureter without any filling defects. There was no hydronephrosis of the collecting system. There was a 1.5 cm filling defect in the left renal pelvis corresponding to the large left renal pelvis stone. A 0.038 sensor wire was then passed up to the level of the renal pelvis and secured to the drape as a safety wire. The ureteral catheter and cystoscope were removed, leaving the safety wire in place.   A semi-rigid ureteroscope was passed alongside the wire up the distal ureter which appeared narrowed.  There is a focus of narrowing at the area of the iliac vessels.  A second 0.038 sensor wire was passed under direct vision and the semirigid scope was removed. An 8/10 Fr dilator and subsequently a dual-lumen catheter was advanced however both the 10 French dilator and the dual-lumen catheter was unable to be advanced beyond the level of the iliac vessels.  I then passed the inner sheath of a 11/13 [place] ureteral access sheath and this as well did not advance beyond the level of the iliac vessels and slight buckling.  Next, I placed the sheath up to the level of the iliac vessels.   Then after some difficulty, I was unable to advance a dual lumen scope however was able to advance a single-lumen ureteroscope throughout the proximal ureter into the kidney. The collecting system was inspected. The calculus was identified at the left renal pelvis. Using the 272 micron holmium laser fiber, the stone was dusted 50% and fragmented fragmented the remaining 50%.  After this, visualization was quite difficult given the amount of stone and debris present.  I felt that it would be unwise to basket extract the stones throughout the narrowed proximal ureter without an adequate access sheath in place.  Thus, I elected to leave a ureteral stent to allow for passive dilation to complete second stage ureteroscopy to basket extract her stones.  With the ureteroscope in the kidney, a gentle pyelogram was performed to delineate the calyceal system and we evaluated the calyces systematically. We encountered no further large stone.  Stable small tiny fragments.  We then withdrew the ureteroscope back down the ureter along with the access sheath, noting no evidence of any stones along the course of the ureter.  Prior to removing the ureteroscope, we did pass the Glidewire back up to the ureter to the renal pelvis.  Once the ureteroscope was removed, we then used the Glidewire under fluoroscopic guidance and passed up a 6-French x 24 cm double-pigtail ureteral stent up the ureter, making sure that the proximal and distal ends coiled within the kidney and bladder respectively.  The cystoscope was then advanced back into the bladder under vision.  We were able to see the distal stent coiling nicely within the bladder.  The bladder was then emptied with irrigation solution.  The cystoscope was then removed.    The patient tolerated the procedure well and there was no complication. Patient was awoken from anesthesia and taken to the recovery room in stable condition. I was present and scrubbed for the entirety of the  case.  Plan:  Patient will be discharged home.  Will plan for second stage ureteroscopy as scheduled in a couple of weeks after allowing passive dilation of the ureteral stent.   Matt R. Ryiah Bellissimo MD [facility]  Pager: [phone]

## 2022-12-04 NOTE — H&P (Signed)
Office Visit Report     11/11/2022   --------------------------------------------------------------------------------   Marlowe Aschoff. Depies  MRN: [id number]  DOB: [birth year], 67 year old Female  SSN: [id number]   PRIMARY CARE:     REFERRING:  Buzzy Han, PA  PROVIDER:  Jettie Pagan, M.D.  LOCATION:  [facility] [phone]     --------------------------------------------------------------------------------   CC/HPI: Shantanique Gambell is a 67 year old female who is seen in consultation today for a large left renal pelvis stone.   #1. Left renal pelvis stone:  She has had no history of urolithiasis. Most recent CT A/P 11/06/2022 demonstrates large left renal stone measuring 1.6 cm with mild hydronephrosis and adjacent peripelvic and perinephric fat stranding. She has no right-sided stones or hydronephrosis. Currently, her pain is very well-controlled. She denies nausea, emesis, fevers, chills.     ALLERGIES: Lisinopril TABS Oxycodone-Acetaminophen TABS Penicillin    MEDICATIONS: Metformin Hcl 500 mg tablet  Simvastatin 40 mg tablet  Tamsulosin Hcl 0.4 mg capsule 1 capsule PO Q HS  Caltrate  Glimepiride 2 mg tablet  Losartan Potassium 25 mg tablet  Naproxen  Ozempic 0.25 mg or 0.5 mg dose (2 mg/1.5 ml) pen injector  Tramadol Hcl  Venlafaxine Hcl 75 mg tablet     GU PSH: No GU PSH    NON-GU PSH: Appendectomy (laparoscopic)     GU PMH: Flank Pain (Stable) - 11/06/2022, (Stable), - 03/05/2022, - 02/28/2021, - 01/23/2021 History of urolithiasis - 11/06/2022 Ureteral calculus (Stable), Approximately 11 x 17 mm in left renal pelvis - 11/06/2022, - 08/29/2021, - 02/28/2021, - 01/23/2021, - 2022, Probable tiny right ureteral stone with associated right hydronephrosis, symptoms have resolved. The patient cannot void for [place] today for urinalysis., - 2022 Gross hematuria - 03/05/2022 Acute Cystitis/UTI - 08/29/2021    NON-GU PMH: Anxiety Arthritis Depression Diabetes Type  2 Hypercholesterolemia Sleep Apnea    FAMILY HISTORY: Diabetes - Brother, Sister   SOCIAL HISTORY: Marital Status: Married Ethnicity: Not Hispanic Or Latino; Race: White Current Smoking Status: Patient does not smoke anymore.   Tobacco Use Assessment Completed: Used Tobacco in last 30 days? Has never drank.  Drinks 3 caffeinated drinks per day.    REVIEW OF SYSTEMS:    GU Review Female:   Patient denies frequent urination, hard to postpone urination, burning /pain with urination, get up at night to urinate, leakage of urine, stream starts and stops, trouble starting your stream, have to strain to urinate, and being pregnant.  Gastrointestinal (Upper):   Patient denies nausea, vomiting, and indigestion/ heartburn.  Gastrointestinal (Lower):   Patient denies diarrhea and constipation.  Constitutional:   Patient denies fever, night sweats, weight loss, and fatigue.  Skin:   Patient denies skin rash/ lesion and itching.  Eyes:   Patient denies blurred vision and double vision.  Ears/ Nose/ Throat:   Patient denies sore throat and sinus problems.  Hematologic/Lymphatic:   Patient denies swollen glands and easy bruising.  Cardiovascular:   Patient denies leg swelling and chest pains.  Respiratory:   Patient denies cough and shortness of breath.  Endocrine:   Patient denies excessive thirst.  Musculoskeletal:   Patient denies back pain and joint pain.  Neurological:   Patient denies headaches and dizziness.  Psychologic:   Patient denies depression and anxiety.   VITAL SIGNS: None   Complexity of Data:  Source Of History:  Patient, Medical Record Summary  Records Review:   Previous Doctor Records, Previous Hospital Records, Previous Patient Records  Urine Test  Review:   Urinalysis  X-Ray Review: C.T. Abdomen/Pelvis: Reviewed Films. Reviewed Report. Discussed With Patient.     PROCEDURES:          Visit Complexity - G2211          Urinalysis w/Scope - 81001 Dipstick Dipstick  Cont'd Micro  Color: Yellow Bilirubin: Neg WBC/hpf: 0 - 5/hpf  Appearance: Cloudy Ketones: Neg RBC/hpf: 20 - 40/hpf  Specific Gravity: 1.020 Blood: 3+ Bacteria: Many (>50/hpf)  pH: 5.5 Protein: 3+ Cystals: NS (Not Seen)  Glucose: Neg Urobilinogen: 0.2 Casts: Hyaline    Nitrites: Neg Trichomonas: Not Present    Leukocyte Esterase: 1+ Mucous: Present      Epithelial Cells: 6 - 10/hpf      Yeast: NS (Not Seen)      Sperm: Not Present    Notes:  Renal Tubular epithelium present     ASSESSMENT:      ICD-10 Details  1 GU:   Renal calculus - N20.0      PLAN:           Document Letter(s):  Created for Patient: Clinical Summary         Notes:   #1. Left renal pelvis stone:  -CT A/P 11/06/2022 with 1.6 cm stone. I discussed options including PCNL which would be the most successful option with stone removal likely in 1 procedure or staged ureteroscopy which would carry less risk of bleeding complications however it would likely be a staged approach. After considering her options, she elects proceed with stage left uteroscopy. Surgery letter sent for first and second stage left ureteroscopy with laser lithotripsy and basket traction of stone. We discussed that if we are able to clear ultrasound in 1 case, the second 1 would be canceled.   We discussed the options for management of kidney stones, including observation, ESWL, ureteroscopy with laser lithotripsy, and PCNL. The risks and benefits of each option were discussed.  For observation I described the risks which include but are not limited to silent renal damage, life-threatening infection, need for emergent surgery, failure to pass stone, and pain.   ESWL: risks and benefits of ESWL were outlined including infection, bleeding, pain, steinstrasse, kidney injury, need for ancillary treatments, and global anesthesia risks including but not limited to CVA, MI, DVT, PE, pneumonia, and death.   Ureteroscopy: risks and benefits of ureteroscopy  were outlined, including infection, bleeding, pain, temporary ureteral stent and associated stent bother, ureteral injury, ureteral stricture, need for ancillary treatments, and global anesthesia risks including but not limited to CVA, MI, DVT, PE, pneumonia, and death.   PCNL: risks and benefits of PCNL were outlined including infection, bleeding, blood transfusion, pain, pneumothorax, bowel injury, persistent urine leak, positioning injury, inability to clear stone burden, renal laceration, arterial venous fistula or malformation, need for ancillary treatments, and global anesthesia risks including but not limited to CVA, MI, DVT, PE, pneumonia, and death.   We discussed dietary methods for stone prevention including the following: increased water intake to 2-3 liters per day, add lemon or lemon concentrate to water to increase citrate which is beneficial for stone prevention, limiting dietary sodium to less than 2000 mg per day, limiting animal protein to less than 2 servings (16 ounces/day), and limiting foods high in oxalate content (spinach, beans, chocolate, etc.).        Next Appointment:      Next Appointment: 12/04/2022 11:30 AM    Appointment Type: Surgery     Location: [facility] -  [id number]    Provider: Jettie Pagan, M.D.    Reason for Visit: NE/OP 1ST STAGE CYSTO, (L) RPG, (L) URS, HLL, (L) STENT    Urology Preoperative H&P   Chief Complaint: Left renal pelvis stone  History of Present Illness: ONESHA BALLER is a 67 y.o. female with a left renal pelvis stone here for first stage left ureteroscopy with laser lithotripsy and basket extraction of the stone. Denies fevers, chills, dysuria.    Past Medical History:  Diagnosis Date   Anxiety    Arthritis    hips, knees   COPD (chronic obstructive pulmonary disease) (HCC)    recently dx 10/2022 no current meds/symptoms, found on routine x ray   Diabetes mellitus without complication (HCC)    Emphysema of lung (HCC)     recent dx 10/2022, no meds/symptoms, found on routine xray   History of kidney stones    Sleep apnea    recent weight loss, does not use CPAP    Past Surgical History:  Procedure Laterality Date   APPENDECTOMY     67 yrs old   COLONOSCOPY     several, last ~2021    Allergies:  Allergies  Allergen Reactions   Lisinopril Cough   Oxycodone-Acetaminophen Itching   Penicillins Itching    History reviewed. No pertinent family history.  Social History:  reports that she quit smoking about 6 years ago. Her smoking use included cigarettes. She has never used smokeless tobacco. She reports that she does not drink alcohol and does not use drugs.  ROS: A complete review of systems was performed.  All systems are negative except for pertinent findings as noted.  Physical Exam:  Vital signs in last 24 hours: Temp:  [98.1 F (36.7 C)] 98.1 F (36.7 C) (06/14 0956) Pulse Rate:  [70] 70 (06/14 0956) Resp:  [16] 16 (06/14 0956) BP: (111)/(65) 111/65 (06/14 0956) SpO2:  [97 %] 97 % (06/14 0956) Weight:  [80.5 kg] 80.5 kg (06/14 0956) Constitutional:  Alert and oriented, No acute distress Cardiovascular: Regular rate and rhythm Respiratory: Normal respiratory effort, Lungs clear bilaterally GI: Abdomen is soft, nontender, nondistended, no abdominal masses GU: No CVA tenderness Lymphatic: No lymphadenopathy Neurologic: Grossly intact, no focal deficits Psychiatric: Normal mood and affect  Laboratory Data:  Recent Labs    12/04/22 1019  HGB 10.5*  HCT 31.0*    Recent Labs    12/04/22 1019  NA 143  K 3.9  CL 106  GLUCOSE 98  BUN 11  CREATININE 0.70     Results for orders placed or performed during the hospital encounter of 12/04/22 (from the past 24 hour(s))  I-STAT, chem 8     Status: Abnormal   Collection Time: 12/04/22 10:19 AM  Result Value Ref Range   Sodium 143 135 - 145 mmol/L   Potassium 3.9 3.5 - 5.1 mmol/L   Chloride 106 98 - 111 mmol/L   BUN 11 8 - 23  mg/dL   Creatinine, Ser 6.04 0.44 - 1.00 mg/dL   Glucose, Bld 98 70 - 99 mg/dL   Calcium, Ion 5.40 9.81 - 1.40 mmol/L   TCO2 28 22 - 32 mmol/L   Hemoglobin 10.5 (L) 12.0 - 15.0 g/dL   HCT 19.1 (L) 47.8 - 29.5 %   No results found for this or any previous visit (from the past 240 hour(s)).  Renal Function: Recent Labs    12/04/22 1019  CREATININE 0.70   Estimated Creatinine Clearance: 72 mL/min (by C-G  formula based on SCr of 0.7 mg/dL).  Radiologic Imaging: No results found.  I independently reviewed the above imaging studies.  Assessment and Plan GALA MERRIHEW is a 67 y.o. female with left renal pelvis stone here for first stage left ureteroscopy with laser lithotripsy and basket extraction of the stone. Denies fevers, chills, dysuria.   Matt R. Seddrick Flax MD 12/04/2022, 10:55 AM  [facility] Pager: [phone]

## 2022-12-07 ENCOUNTER — Encounter (HOSPITAL_BASED_OUTPATIENT_CLINIC_OR_DEPARTMENT_OTHER): Payer: Self-pay | Admitting: Urology

## 2022-12-07 NOTE — Anesthesia Postprocedure Evaluation (Signed)
Anesthesia Post Note  Patient: Joanna [name]  Procedure(s) Performed: CYSTOSCOPY/LEFT RETROGRADE PYELOGRAM/LEFT URETEROSCOPY/HOLMIUM LASER/LEFT STENT PLACEMENT (Left: Renal)     Patient location during evaluation: PACU Anesthesia Type: General Level of consciousness: awake and alert Pain management: pain level controlled Vital Signs Assessment: post-procedure vital signs reviewed and stable Respiratory status: spontaneous breathing, nonlabored ventilation, respiratory function stable and patient connected to nasal cannula oxygen Cardiovascular status: blood pressure returned to baseline and stable Postop Assessment: no apparent nausea or vomiting Anesthetic complications: no   No notable events documented.  Last Vitals:  Vitals:   12/04/22 1347 12/04/22 1400  BP: (!) 141/64 (!) 140/71  Pulse: 82 78  Resp: 11 15  Temp: (!) 36.3 C   SpO2: 93% 92%    Last Pain:  Vitals:   12/04/22 1450  TempSrc:   PainSc: 4    Pain Goal: Patients Stated Pain Goal: 5 (12/04/22 1450)                 Kreston Ahrendt

## 2022-12-09 ENCOUNTER — Encounter (HOSPITAL_BASED_OUTPATIENT_CLINIC_OR_DEPARTMENT_OTHER): Payer: Self-pay | Admitting: Urology

## 2022-12-09 ENCOUNTER — Other Ambulatory Visit: Payer: Self-pay

## 2022-12-09 NOTE — Progress Notes (Signed)
Spoke w/ via phone for pre-op interview---pt Lab needs dos---- I stat              Lab results------EKG 12-04-2022 epic COVID test -----patient states asymptomatic no test needed Arrive at -------1200 12-25-2022 NPO after MN NO Solid Food.  Clear liquids from MN until---1100 am Med rec completed Medications to take morning of surgery -----ondansetron prn, tramadol prn Diabetic medication -----last dose ozempic 12-13-2022, no diabetic medications day of surgery Patient instructed no nail polish to be worn day of surgery Patient instructed to bring photo id and insurance card day of surgery Patient aware to have Driver (ride ) / caregiver   husband scott  for 24 hours after surgery  Patient Special Instructions -----none Pre-Op special Instructions -----none Patient verbalized understanding of instructions that were given at this phone interview. Patient denies shortness of breath, chest pain, fever, cough at this phone interview.

## 2022-12-25 ENCOUNTER — Ambulatory Visit (HOSPITAL_BASED_OUTPATIENT_CLINIC_OR_DEPARTMENT_OTHER)
Admission: RE | Admit: 2022-12-25 | Discharge: 2022-12-25 | Disposition: A | Discharge status: Home / Self Care | Payer: Medicare Other | Attending: Urology | Admitting: Urology

## 2022-12-25 ENCOUNTER — Encounter (HOSPITAL_BASED_OUTPATIENT_CLINIC_OR_DEPARTMENT_OTHER): Payer: Self-pay | Admitting: Urology

## 2022-12-25 ENCOUNTER — Ambulatory Visit (HOSPITAL_BASED_OUTPATIENT_CLINIC_OR_DEPARTMENT_OTHER): Payer: Medicare Other | Admitting: Anesthesiology

## 2022-12-25 ENCOUNTER — Other Ambulatory Visit: Payer: Self-pay

## 2022-12-25 ENCOUNTER — Encounter (HOSPITAL_BASED_OUTPATIENT_CLINIC_OR_DEPARTMENT_OTHER)
Admission: RE | Disposition: A | Discharge status: Home / Self Care | Payer: Self-pay | Source: Home / Self Care | Attending: Urology

## 2022-12-25 DIAGNOSIS — E119 Type 2 diabetes mellitus without complications: Secondary | ICD-10-CM | POA: Diagnosis not present

## 2022-12-25 DIAGNOSIS — M1990 Unspecified osteoarthritis, unspecified site: Secondary | ICD-10-CM | POA: Diagnosis not present

## 2022-12-25 DIAGNOSIS — Z87891 Personal history of nicotine dependence: Secondary | ICD-10-CM | POA: Diagnosis not present

## 2022-12-25 DIAGNOSIS — F419 Anxiety disorder, unspecified: Secondary | ICD-10-CM | POA: Diagnosis not present

## 2022-12-25 DIAGNOSIS — Z01818 Encounter for other preprocedural examination: Secondary | ICD-10-CM

## 2022-12-25 DIAGNOSIS — N200 Calculus of kidney: Secondary | ICD-10-CM

## 2022-12-25 DIAGNOSIS — Z7984 Long term (current) use of oral hypoglycemic drugs: Secondary | ICD-10-CM | POA: Diagnosis not present

## 2022-12-25 DIAGNOSIS — G4730 Sleep apnea, unspecified: Secondary | ICD-10-CM | POA: Insufficient documentation

## 2022-12-25 DIAGNOSIS — J449 Chronic obstructive pulmonary disease, unspecified: Secondary | ICD-10-CM | POA: Insufficient documentation

## 2022-12-25 HISTORY — PX: CYSTOSCOPY/URETEROSCOPY/HOLMIUM LASER/STENT PLACEMENT: SHX6546

## 2022-12-25 LAB — POCT I-STAT, CHEM 8
BUN: 16 mg/dL (ref 8–23)
Calcium, Ion: 1.25 mmol/L (ref 1.15–1.40)
Chloride: 107 mmol/L (ref 98–111)
Creatinine, Ser: 0.9 mg/dL (ref 0.44–1.00)
Glucose, Bld: 108 mg/dL — ABNORMAL HIGH (ref 70–99)
HCT: 33 % — ABNORMAL LOW (ref 36.0–46.0)
Hemoglobin: 11.2 g/dL — ABNORMAL LOW (ref 12.0–15.0)
Potassium: 4.4 mmol/L (ref 3.5–5.1)
Sodium: 142 mmol/L (ref 135–145)
TCO2: 24 mmol/L (ref 22–32)

## 2022-12-25 LAB — GLUCOSE, CAPILLARY: Glucose-Capillary: 104 mg/dL — ABNORMAL HIGH (ref 70–99)

## 2022-12-25 SURGERY — CYSTOSCOPY/URETEROSCOPY/HOLMIUM LASER/STENT PLACEMENT
Anesthesia: General | Laterality: Left

## 2022-12-25 MED ORDER — HYDROCODONE-ACETAMINOPHEN 5-325 MG PO TABS: 1.0000 | ORAL_TABLET | ORAL | 0 refills | Status: AC | PRN

## 2022-12-25 MED ORDER — LIDOCAINE HCL (PF) 2 % IJ SOLN
INTRAMUSCULAR | Status: AC
  Filled 2022-12-25: qty 5

## 2022-12-25 MED ORDER — FENTANYL CITRATE (PF) 100 MCG/2ML IJ SOLN
INTRAMUSCULAR | Status: DC | PRN
  Administered 2022-12-25: 25 ug via INTRAVENOUS
  Administered 2022-12-25: 50 ug via INTRAVENOUS
  Administered 2022-12-25 (×2): 25 ug via INTRAVENOUS
  Administered 2022-12-25: 50 ug via INTRAVENOUS
  Administered 2022-12-25: 25 ug via INTRAVENOUS

## 2022-12-25 MED ORDER — IOHEXOL 300 MG/ML  SOLN
INTRAMUSCULAR | Status: DC | PRN
  Administered 2022-12-25: 20 mL via URETHRAL

## 2022-12-25 MED ORDER — PROPOFOL 10 MG/ML IV BOLUS
INTRAVENOUS | Status: AC
  Filled 2022-12-25: qty 20

## 2022-12-25 MED ORDER — MIDAZOLAM HCL 5 MG/5ML IJ SOLN
INTRAMUSCULAR | Status: DC | PRN
  Administered 2022-12-25 (×2): 1 mg via INTRAVENOUS

## 2022-12-25 MED ORDER — EPHEDRINE SULFATE (PRESSORS) 50 MG/ML IJ SOLN
INTRAMUSCULAR | Status: DC | PRN
  Administered 2022-12-25: 7 mg via INTRAVENOUS

## 2022-12-25 MED ORDER — FENTANYL CITRATE (PF) 250 MCG/5ML IJ SOLN
INTRAMUSCULAR | Status: AC
  Filled 2022-12-25: qty 5

## 2022-12-25 MED ORDER — SODIUM CHLORIDE 0.9 % IR SOLN
Status: DC | PRN
  Administered 2022-12-25: 3000 mL

## 2022-12-25 MED ORDER — CIPROFLOXACIN IN D5W 400 MG/200ML IV SOLN
400.0000 mg | INTRAVENOUS | Status: AC
  Administered 2022-12-25: 400 mg via INTRAVENOUS

## 2022-12-25 MED ORDER — DEXMEDETOMIDINE HCL IN NACL 80 MCG/20ML IV SOLN
INTRAVENOUS | Status: DC | PRN
  Administered 2022-12-25: 8 ug via INTRAVENOUS

## 2022-12-25 MED ORDER — DEXAMETHASONE SODIUM PHOSPHATE 10 MG/ML IJ SOLN
INTRAMUSCULAR | Status: AC
  Filled 2022-12-25: qty 1

## 2022-12-25 MED ORDER — ONDANSETRON HCL 4 MG/2ML IJ SOLN
INTRAMUSCULAR | Status: AC
  Filled 2022-12-25: qty 2

## 2022-12-25 MED ORDER — LIDOCAINE HCL (CARDIAC) PF 100 MG/5ML IV SOSY
PREFILLED_SYRINGE | INTRAVENOUS | Status: DC | PRN
  Administered 2022-12-25: 80 mg via INTRAVENOUS

## 2022-12-25 MED ORDER — ACETAMINOPHEN 500 MG PO TABS
ORAL_TABLET | ORAL | Status: AC
  Filled 2022-12-25: qty 2

## 2022-12-25 MED ORDER — DEXAMETHASONE SODIUM PHOSPHATE 10 MG/ML IJ SOLN
INTRAMUSCULAR | Status: DC | PRN
  Administered 2022-12-25: 8 mg via INTRAVENOUS

## 2022-12-25 MED ORDER — ACETAMINOPHEN 500 MG PO TABS
1000.0000 mg | ORAL_TABLET | Freq: Once | ORAL | Status: AC
  Administered 2022-12-25: 1000 mg via ORAL

## 2022-12-25 MED ORDER — LACTATED RINGERS IV SOLN: INTRAVENOUS | Status: DC

## 2022-12-25 MED ORDER — PROPOFOL 10 MG/ML IV BOLUS
INTRAVENOUS | Status: DC | PRN
  Administered 2022-12-25: 150 mg via INTRAVENOUS

## 2022-12-25 MED ORDER — ONDANSETRON HCL 4 MG/2ML IJ SOLN
INTRAMUSCULAR | Status: DC | PRN
  Administered 2022-12-25: 4 mg via INTRAVENOUS

## 2022-12-25 MED ORDER — MIDAZOLAM HCL 2 MG/2ML IJ SOLN
INTRAMUSCULAR | Status: AC
  Filled 2022-12-25: qty 2

## 2022-12-25 MED ORDER — CIPROFLOXACIN IN D5W 400 MG/200ML IV SOLN
INTRAVENOUS | Status: AC
  Filled 2022-12-25: qty 200

## 2022-12-25 MED ORDER — EPHEDRINE 5 MG/ML INJ
INTRAVENOUS | Status: AC
  Filled 2022-12-25: qty 5

## 2022-12-25 SURGICAL SUPPLY — 27 items
BAG DRAIN URO-CYSTO SKYTR STRL (DRAIN) ×1 IMPLANT
BAG DRN UROCATH (DRAIN) ×1
BASKET ZERO TIP NITINOL 2.4FR (BASKET) IMPLANT
BSKT STON RTRVL ZERO TP 2.4FR (BASKET) ×1
CATH SET URETHRAL DILATOR (CATHETERS) IMPLANT
CATH URETL OPEN 5X70 (CATHETERS) ×1 IMPLANT
CLOTH BEACON ORANGE TIMEOUT ST (SAFETY) ×1 IMPLANT
GLOVE BIO SURGEON STRL SZ7 (GLOVE) ×1 IMPLANT
GOWN STRL REUS W/TWL LRG LVL3 (GOWN DISPOSABLE) ×1 IMPLANT
GUIDEWIRE STR DUAL SENSOR (WIRE) ×1 IMPLANT
GUIDEWIRE ZIPWRE .038 STRAIGHT (WIRE) IMPLANT
IV NS 1000ML (IV SOLUTION) ×1
IV NS 1000ML BAXH (IV SOLUTION) ×1 IMPLANT
IV NS IRRIG 3000ML ARTHROMATIC (IV SOLUTION) ×1 IMPLANT
KIT TURNOVER CYSTO (KITS) ×1 IMPLANT
LASER FIB FLEXIVA PULSE ID 365 (Laser) IMPLANT
MANIFOLD NEPTUNE II (INSTRUMENTS) ×1 IMPLANT
NS IRRIG 500ML POUR BTL (IV SOLUTION) ×1 IMPLANT
PACK CYSTO (CUSTOM PROCEDURE TRAY) ×1 IMPLANT
SHEATH NAVIGATOR HD 12/14X36 (SHEATH) IMPLANT
SLEEVE SCD COMPRESS KNEE MED (STOCKING) ×1 IMPLANT
STENT URET 6FRX24 CONTOUR (STENTS) IMPLANT
SYR 10ML LL (SYRINGE) ×1 IMPLANT
TRACTIP FLEXIVA PULS ID 200XHI (Laser) IMPLANT
TRACTIP FLEXIVA PULSE ID 200 (Laser)
TUBE CONNECTING 12X1/4 (SUCTIONS) ×1 IMPLANT
TUBING UROLOGY SET (TUBING) ×1 IMPLANT

## 2022-12-25 NOTE — Anesthesia Preprocedure Evaluation (Signed)
Anesthesia Evaluation  Patient identified by MRN, date of birth, ID band Patient awake    Reviewed: Allergy & Precautions, NPO status , Patient's Chart, lab work & pertinent test results  Airway Mallampati: II  TM Distance: >3 FB Neck ROM: Full    Dental  (+) Teeth Intact, Dental Advisory Given   Pulmonary sleep apnea , COPD, former smoker   Pulmonary exam normal breath sounds clear to auscultation       Cardiovascular negative cardio ROS Normal cardiovascular exam Rhythm:Regular Rate:Normal     Neuro/Psych  PSYCHIATRIC DISORDERS Anxiety     negative neurological ROS     GI/Hepatic negative GI ROS, Neg liver ROS,,,  Endo/Other  diabetes, Type 2, Oral Hypoglycemic Agents    Renal/GU negative Renal ROS     Musculoskeletal  (+) Arthritis ,    Abdominal   Peds  Hematology negative hematology ROS (+)   Anesthesia Other Findings Day of surgery medications reviewed with the patient.  Reproductive/Obstetrics                             Anesthesia Physical Anesthesia Plan  ASA: 3  Anesthesia Plan: General   Post-op Pain Management: Tylenol PO (pre-op)*   Induction: Intravenous  PONV Risk Score and Plan: 4 or greater and Dexamethasone, Ondansetron and Midazolam  Airway Management Planned: LMA  Additional Equipment:   Intra-op Plan:   Post-operative Plan: Extubation in OR  Informed Consent: I have reviewed the patients History and Physical, chart, labs and discussed the procedure including the risks, benefits and alternatives for the proposed anesthesia with the patient or authorized representative who has indicated his/her understanding and acceptance.     Dental advisory given  Plan Discussed with: CRNA  Anesthesia Plan Comments:        Anesthesia Quick Evaluation

## 2022-12-25 NOTE — H&P (Signed)
Office Visit Report     11/11/2022   --------------------------------------------------------------------------------   Joanna Aschoff. [name]  MRN: [id number]  DOB: 01-29-1956, 67 year old Female  SSN: [id number]   PRIMARY CARE:     REFERRING:  Joanna Han, PA  PROVIDER:  Jettie Pagan, M.D.  LOCATION:  [facility] [phone]     --------------------------------------------------------------------------------   CC/HPI: Joanna [name] is a 67 year old female who is seen in consultation today for a large left renal pelvis stone.   #1. Left renal pelvis stone:  She has had no history of urolithiasis. Most recent CT A/P 11/06/2022 demonstrates large left renal stone measuring 1.6 cm with mild hydronephrosis and adjacent peripelvic and perinephric fat stranding. She has no right-sided stones or hydronephrosis. Currently, her pain is very well-controlled. She denies nausea, emesis, fevers, chills.     ALLERGIES: Lisinopril TABS Oxycodone-Acetaminophen TABS Penicillin    MEDICATIONS: Metformin Hcl 500 mg tablet  Simvastatin 40 mg tablet  Tamsulosin Hcl 0.4 mg capsule 1 capsule PO Q HS  Caltrate  Glimepiride 2 mg tablet  Losartan Potassium 25 mg tablet  Naproxen  Ozempic 0.25 mg or 0.5 mg dose (2 mg/1.5 ml) pen injector  Tramadol Hcl  Venlafaxine Hcl 75 mg tablet     GU PSH: No GU PSH    NON-GU PSH: Appendectomy (laparoscopic)     GU PMH: Flank Pain (Stable) - 11/06/2022, (Stable), - 03/05/2022, - 02/28/2021, - 01/23/2021 History of urolithiasis - 11/06/2022 Ureteral calculus (Stable), Approximately 11 x 17 mm in left renal pelvis - 11/06/2022, - 08/29/2021, - 02/28/2021, - 01/23/2021, - 2022, Probable tiny right ureteral stone with associated right hydronephrosis, symptoms have resolved. The patient cannot void for [place] today for urinalysis., - 2022 Gross hematuria - 03/05/2022 Acute Cystitis/UTI - 08/29/2021    NON-GU PMH: Anxiety Arthritis Depression Diabetes Type  2 Hypercholesterolemia Sleep Apnea    FAMILY HISTORY: Diabetes - Brother, Sister   SOCIAL HISTORY: Marital Status: Married Ethnicity: Not Hispanic Or Latino; Race: White Current Smoking Status: Patient does not smoke anymore.   Tobacco Use Assessment Completed: Used Tobacco in last 30 days? Has never drank.  Drinks 3 caffeinated drinks per day.    REVIEW OF SYSTEMS:    GU Review Female:   Patient denies frequent urination, hard to postpone urination, burning /pain with urination, get up at night to urinate, leakage of urine, stream starts and stops, trouble starting your stream, have to strain to urinate, and being pregnant.  Gastrointestinal (Upper):   Patient denies nausea, vomiting, and indigestion/ heartburn.  Gastrointestinal (Lower):   Patient denies diarrhea and constipation.  Constitutional:   Patient denies fever, night sweats, weight loss, and fatigue.  Skin:   Patient denies skin rash/ lesion and itching.  Eyes:   Patient denies blurred vision and double vision.  Ears/ Nose/ Throat:   Patient denies sore throat and sinus problems.  Hematologic/Lymphatic:   Patient denies swollen glands and easy bruising.  Cardiovascular:   Patient denies leg swelling and chest pains.  Respiratory:   Patient denies cough and shortness of breath.  Endocrine:   Patient denies excessive thirst.  Musculoskeletal:   Patient denies back pain and joint pain.  Neurological:   Patient denies headaches and dizziness.  Psychologic:   Patient denies depression and anxiety.   VITAL SIGNS: None   Complexity of Data:  Source Of History:  Patient, Medical Record Summary  Records Review:   Previous Doctor Records, Previous Hospital Records, Previous Patient Records  Urine Test  Review:   Urinalysis  X-Ray Review: C.T. Abdomen/Pelvis: Reviewed Films. Reviewed Report. Discussed With Patient.     PROCEDURES:          Visit Complexity - G2211          Urinalysis w/Scope - 81001 Dipstick Dipstick  Cont'd Micro  Color: Yellow Bilirubin: Neg WBC/hpf: 0 - 5/hpf  Appearance: Cloudy Ketones: Neg RBC/hpf: 20 - 40/hpf  Specific Gravity: 1.020 Blood: 3+ Bacteria: Many (>50/hpf)  pH: 5.5 Protein: 3+ Cystals: NS (Not Seen)  Glucose: Neg Urobilinogen: 0.2 Casts: Hyaline    Nitrites: Neg Trichomonas: Not Present    Leukocyte Esterase: 1+ Mucous: Present      Epithelial Cells: 6 - 10/hpf      Yeast: NS (Not Seen)      Sperm: Not Present    Notes:  Renal Tubular epithelium present     ASSESSMENT:      ICD-10 Details  1 GU:   Renal calculus - N20.0      PLAN:           Document Letter(s):  Created for Patient: Clinical Summary         Notes:   #1. Left renal pelvis stone:  -CT A/P 11/06/2022 with 1.6 cm stone. I discussed options including PCNL which would be the most successful option with stone removal likely in 1 procedure or staged ureteroscopy which would carry less risk of bleeding complications however it would likely be a staged approach. After considering her options, she elects proceed with stage left uteroscopy. Surgery letter sent for first and second stage left ureteroscopy with laser lithotripsy and basket traction of stone. We discussed that if we are able to clear ultrasound in 1 case, the second 1 would be canceled.   We discussed the options for management of kidney stones, including observation, ESWL, ureteroscopy with laser lithotripsy, and PCNL. The risks and benefits of each option were discussed.  For observation I described the risks which include but are not limited to silent renal damage, life-threatening infection, need for emergent surgery, failure to pass stone, and pain.   ESWL: risks and benefits of ESWL were outlined including infection, bleeding, pain, steinstrasse, kidney injury, need for ancillary treatments, and global anesthesia risks including but not limited to CVA, MI, DVT, PE, pneumonia, and death.   Ureteroscopy: risks and benefits of ureteroscopy  were outlined, including infection, bleeding, pain, temporary ureteral stent and associated stent bother, ureteral injury, ureteral stricture, need for ancillary treatments, and global anesthesia risks including but not limited to CVA, MI, DVT, PE, pneumonia, and death.   PCNL: risks and benefits of PCNL were outlined including infection, bleeding, blood transfusion, pain, pneumothorax, bowel injury, persistent urine leak, positioning injury, inability to clear stone burden, renal laceration, arterial venous fistula or malformation, need for ancillary treatments, and global anesthesia risks including but not limited to CVA, MI, DVT, PE, pneumonia, and death.   We discussed dietary methods for stone prevention including the following: increased water intake to 2-3 liters per day, add lemon or lemon concentrate to water to increase citrate which is beneficial for stone prevention, limiting dietary sodium to less than 2000 mg per day, limiting animal protein to less than 2 servings (16 ounces/day), and limiting foods high in oxalate content (spinach, beans, chocolate, etc.).        Next Appointment:      Next Appointment: 12/04/2022 11:30 AM    Appointment Type: Surgery     Location: [facility] -  [id number]    Provider: Jettie Pagan, M.D.    Reason for Visit: NE/OP 1ST STAGE CYSTO, (L) RPG, (L) URS, HLL, (L) STENT     Urology Preoperative H&P   Chief Complaint: Left renal stones  History of Present Illness: Joanna [name] is a 67 y.o. female with left renal stones here for second look left URS/LL, stent exchange. Denies fevers, chills, dysuria.     Past Medical History:  Diagnosis Date   Anxiety    Arthritis    hips, knees   COPD (chronic obstructive pulmonary disease) (HCC)    recently dx 10/2022 no current meds/symptoms, found on routine x ray   Diabetes mellitus without complication (HCC) type 2    Emphysema of lung (HCC)    recent dx 10/2022, no meds/symptoms, found on  routine xray   History of kidney stones    Sleep apnea    recent weight loss, does not use CPAP    Past Surgical History:  Procedure Laterality Date   APPENDECTOMY     67 yrs old   cataract surgery Bilateral 01/2022   COLONOSCOPY     several, last ~2021   CYSTOSCOPY/URETEROSCOPY/HOLMIUM LASER/STENT PLACEMENT Left 12/04/2022   Procedure: CYSTOSCOPY/LEFT RETROGRADE PYELOGRAM/LEFT URETEROSCOPY/HOLMIUM LASER/LEFT STENT PLACEMENT;  Surgeon: Jannifer Hick, MD;  Location: [facility];  Service: Urology;  Laterality: Left;  75 MINUTES NEEDED FOR CASE   EYE SURGERY      Allergies:  Allergies  Allergen Reactions   Lisinopril Cough   Oxycodone-Acetaminophen Itching   Penicillins Itching    History reviewed. No pertinent family history.  Social History:  reports that she quit smoking about 6 years ago. Her smoking use included cigarettes. She has never used smokeless tobacco. She reports that she does not drink alcohol and does not use drugs.  ROS: A complete review of systems was performed.  All systems are negative except for pertinent findings as noted.  Physical Exam:  Vital signs in last 24 hours: Temp:  [98.9 F (37.2 C)] 98.9 F (37.2 C) (07/05 1116) Pulse Rate:  [78] 78 (07/05 1116) Resp:  [17] 17 (07/05 1116) BP: (122)/(61) 122/61 (07/05 1116) SpO2:  [97 %] 97 % (07/05 1116) Weight:  [78.9 kg] 78.9 kg (07/05 1116) Constitutional:  Alert and oriented, No acute distress Cardiovascular: Regular rate and rhythm Respiratory: Normal respiratory effort, Lungs clear bilaterally GI: Abdomen is soft, nontender, nondistended, no abdominal masses GU: No CVA tenderness Lymphatic: No lymphadenopathy Neurologic: Grossly intact, no focal deficits Psychiatric: Normal mood and affect  Laboratory Data:  No results for input(s): "WBC", "HGB", "HCT", "PLT" in the last 72 hours.  No results for input(s): "NA", "K", "CL", "GLUCOSE", "BUN", "CALCIUM", "CREATININE" in the  last 72 hours.  Invalid input(s): "CO3"   No results found for this or any previous visit (from the past 24 hour(s)). No results found for this or any previous visit (from the past 240 hour(s)).  Renal Function: No results for input(s): "CREATININE" in the last 168 hours. CrCl cannot be calculated (Patient's most recent lab result is older than the maximum 21 days allowed.).  Radiologic Imaging: No results found.  I independently reviewed the above imaging studies.  Assessment and Plan Joanna [name] is a 67 y.o. female with left renal stones here for second look left URS/LL given prior stenotic left ureter.  -The risks, benefits and alternatives of cystoscopy with left JJ stent placement was discussed with the patient.  Risks include, but are not limited  to: bleeding, urinary tract infection, ureteral injury, ureteral stricture disease, chronic pain, urinary symptoms, bladder injury, stent migration, the need for nephrostomy tube placement, MI, CVA, DVT, PE and the inherent risks with general anesthesia.  The patient voices understanding and wishes to proceed.       Matt R. Naiyana Barbian MD 12/25/2022, 11:50 AM  [facility] Pager: [phone]): [phone]

## 2022-12-25 NOTE — Discharge Instructions (Signed)
[facility] °[phone] °Post Ureteroscopy With or Without Stent Instructions ° °Definitions: ° °Ureter: The duct that transports urine from the kidney to the bladder. °Stent:   A plastic hollow tube that is placed into the ureter, from the kidney to the bladder to prevent the ureter from swelling shut. ° °GENERAL INSTRUCTIONS: ° °Despite the fact that no skin incisions were used, the area around the ureter and bladder is raw and irritated. The stent is a foreign body which will further irritate the bladder wall. This irritation is manifested by increased frequency of urination, both day and night, and by an increase in the urge to urinate. In some, the urge to urinate is present almost always. Sometimes the urge is strong enough that you may not be able to stop yourself from urinating. The only real cure is to remove the stent and then give time for the bladder wall to heal which can't be done until the danger of the ureter swelling shut has passed, which varies. ° °You may see some blood in your urine while the stent is in place and a few days afterwards. Do not be alarmed, even if the urine was clear for a while. Get off your feet and drink lots of fluids until clearing occurs. If you start to pass clots or don't improve, call us. ° °DIET: °You may return to your normal diet immediately. Because of the raw surface of your bladder, alcohol, spicy foods, acid type foods and drinks with caffeine may cause irritation or frequency and should be used in moderation. To keep your urine flowing freely and to avoid constipation, drink plenty of fluids during the day ( 8-10 glasses ). °Tip: Avoid cranberry juice because it is very acidic. ° °ACTIVITY: °Your physical activity doesn't need to be restricted. However, if you are very active, you may see some blood in your urine. We suggest that you reduce your activity under these circumstances until the bleeding has stopped. ° °BOWELS: °It is important to  keep your bowels regular during the postoperative period. Straining with bowel movements can cause bleeding. A bowel movement every other day is reasonable. Use a mild laxative if needed, such as Milk of Magnesia 2-3 tablespoons, or 2 Dulcolax tablets. Call if you continue to have problems. If you have been taking narcotics for pain, before, during or after your surgery, you may be constipated. Take a laxative if necessary. ° ° °MEDICATION: °You should resume your pre-surgery medications unless told not to. In addition you will often be given an antibiotic to prevent infection. These should be taken as prescribed until the bottles are finished unless you are having an unusual reaction to one of the drugs. ° °PROBLEMS YOU SHOULD REPORT TO US: °Fevers over 100.5 Fahrenheit. °Heavy bleeding, or clots ( See above notes about blood in urine ). °Inability to urinate. °Drug reactions ( hives, rash, nausea, vomiting, diarrhea ). °Severe burning or pain with urination that is not improving. ° °FOLLOW-UP: °You will need a follow-up appointment to monitor your progress. Call for this appointment at the number listed above. Usually the first appointment will be about three to fourteen days after your surgery. ° ° ° °  ° ° ° °Post Anesthesia Home Care Instructions ° °Activity: °Get plenty of rest for the remainder of the day. A responsible individual must stay with you for 24 hours following the procedure.  °For the next 24 hours, DO NOT: °-Drive a car °-Operate machinery °-Drink alcoholic beverages °-Take any medication   unless instructed by your physician °-Make any legal decisions or sign important papers. ° °Meals: °Start with liquid foods such as gelatin or soup. Progress to regular foods as tolerated. Avoid greasy, spicy, heavy foods. If nausea and/or vomiting occur, drink only clear liquids until the nausea and/or vomiting subsides. Call your physician if vomiting continues. ° °Special Instructions/Symptoms: °Your throat  may feel dry or sore from the anesthesia or the breathing tube placed in your throat during surgery. If this causes discomfort, gargle with warm salt water. The discomfort should disappear within 24 hours. ° °

## 2022-12-25 NOTE — Transfer of Care (Signed)
Immediate Anesthesia Transfer of Care Note  Patient: EMMELINA LILJENQUIST  Procedure(s) Performed: 2ND STAGE CYSTOSCOPY/LEFT RETROGRADE PYELOGRAM/LEFT URETEROSCOPY/HOLMIUM LASER/LEFT STENT PLACEMENT (Left)  Patient Location: PACU  Anesthesia Type:General  Level of Consciousness: awake, alert , oriented, and patient cooperative  Airway & Oxygen Therapy: Patient Spontanous Breathing and Patient connected to face mask oxygen  Post-op Assessment: Report given to RN and Post -op Vital signs reviewed and stable  Post vital signs: Reviewed and stable  Last Vitals:  Vitals Value Taken Time  BP 127/60 12/25/22 1435  Temp    Pulse 76 12/25/22 1438  Resp 15 12/25/22 1438  SpO2 100 % 12/25/22 1438  Vitals shown include unvalidated device data.  Last Pain:  Vitals:   12/25/22 1116  TempSrc: Oral  PainSc: 4       Patients Stated Pain Goal: 0 (12/25/22 1116)  Complications: No notable events documented.

## 2022-12-25 NOTE — Op Note (Signed)
Operative Note  Preoperative diagnosis:  1.  Left renal stones  Postoperative diagnosis: 1.  Same  Procedure(s): 1.  Cystoscopy 2. Left ureteroscopy with basket extraction of multiple stones 3. Left retrograde pyelogram 4. Left ureteral stent exchange 5. Fluoroscopy with intraoperative interpretation  Surgeon: Jettie Pagan, MD  Assistants:  None  Anesthesia:  General  Complications:  None  EBL:  Minimal  Specimens: 1. Stones for stone analysis (to be done at [facility])  Drains/Catheters: 1.  Left 6Fr x 24cm ureteral stent without a tether string  Intraoperative findings:   Cystoscopy demonstrated no suspicious bladder lesions. Left ureteroscopy demonstrated innumerable left lower pole stones, successfully basket extracted. Left retrograde pyelogram with no hydronephrosis. Successful stent exchange.  Indication:  Joanna [name] is a 67 y.o. female with left lower pole stones here for second look ureteroscopy and basket extraction of stones.  Description of procedure: After informed consent was obtained from the patient, the patient was identified and taken to the operating room and placed in the supine position.  General anesthesia was administered as well as perioperative IV antibiotics.  At the beginning of the case, a time-out was performed to properly identify the patient, the surgery to be performed, and the surgical site.  Sequential compression devices were applied to the lower extremities at the beginning of the case for DVT prophylaxis.  The patient was then placed in the dorsal lithotomy supine position, prepped and draped in sterile fashion.  We then passed the 21-French rigid cystoscope through the urethra and into the bladder under vision without any difficulty.  A systematic evaluation of the bladder revealed no evidence of any suspicious bladder lesions.  Ureteral orifices were in normal position.    The distal aspect of the ureteral stent was seen  protruding from the left ureteral orifice.  We then used the alligator-tooth forceps and grasped the distal end of the ureteral stent and brought it out the urethral meatus while watching the proximal coil straighten out nicely on fluoroscopy. Through the ureteral stent, we then passed a 0.038 sensor wire up to the level of the renal pelvis.  The ureteral stent was then removed, leaving the sensor wire up the left ureter.    A semi-rigid ureteroscope was passed alongside the wire up the distal ureter which appeared to be dilated after passive dilation with stent. There was persistent ureteral edema. A second 0.038 sensor wire was passed under direct vision and the semirigid scope was removed. An 11/13Fr ureteral access sheath was carefully advanced up the ureter to the level of the UPJ over this wire under fluoroscopic guidance. The flexible ureteroscope was advanced into the collecting system via the access sheath. The collecting system was inspected. The stones were identified at the left lower pole. A 2.2 Fr zero tip basket was used to remove the fragments under visual guidance. These were sent for chemical analysis. With the ureteroscope in the kidney, a gentle pyelogram was performed to delineate the calyceal system and we evaluated the calyces systematically. We encountered no further stones. The rest of the stone fragments were very tiny and these were  irrigated away gently. The calyces were re-inspected and there were no significant stone fragment residual.   We then withdrew the ureteroscope back down the ureter along with the access sheath, noting no evidence of any stones along the course of the ureter.  Prior to removing the ureteroscope, we did pass the Glidewire back up to the ureter to the renal pelvis.  Once the ureteroscope was removed, we then used the Glidewire under fluoroscopic guidance and passed up a 6-French x 26 cm double-pigtail ureteral stent up the ureter, making sure that the  proximal and distal ends coiled within the kidney and bladder respectively. I did not leave a tether string.  The cystoscope was then advanced back into the bladder under vision.  We were able to see the distal stent coiling nicely within the bladder.  The bladder was then emptied with irrigation solution.  The cystoscope was then removed.    The patient tolerated the procedure well and there was no complication. Patient was awoken from anesthesia and taken to the recovery room in stable condition. I was present and scrubbed for the entirety of the case.  Plan:  Patient will be discharged home.  Follow up with me in 7 to 10 days for stent removal in the office.   Joanna R. Esbeydi Manago MD [facility]  Pager: [phone]

## 2022-12-25 NOTE — Anesthesia Procedure Notes (Signed)
Procedure Name: LMA Insertion Date/Time: 12/25/2022 12:48 PM  Performed by: Garth Bigness, CRNAPre-anesthesia Checklist: Patient identified, Emergency Drugs available, Suction available and Patient being monitored Patient Re-evaluated:Patient Re-evaluated prior to induction Oxygen Delivery Method: Circle system utilized Preoxygenation: Pre-oxygenation with 100% oxygen Induction Type: IV induction Ventilation: Mask ventilation without difficulty LMA: LMA inserted LMA Size: 4.0 Tube type: Oral Number of attempts: 1 Placement Confirmation: positive ETCO2 and breath sounds checked- equal and bilateral Tube secured with: Tape Dental Injury: Teeth and Oropharynx as per pre-operative assessment

## 2022-12-28 ENCOUNTER — Encounter (HOSPITAL_BASED_OUTPATIENT_CLINIC_OR_DEPARTMENT_OTHER): Payer: Self-pay | Admitting: Urology

## 2022-12-28 NOTE — Anesthesia Postprocedure Evaluation (Signed)
Anesthesia Post Note  Patient: Joanna [name]  Procedure(s) Performed: 2ND STAGE CYSTOSCOPY/LEFT RETROGRADE PYELOGRAM/LEFT URETEROSCOPY/HOLMIUM LASER/LEFT STENT PLACEMENT (Left)     Patient location during evaluation: PACU Anesthesia Type: General Level of consciousness: awake and alert Pain management: pain level controlled Vital Signs Assessment: post-procedure vital signs reviewed and stable Respiratory status: spontaneous breathing, nonlabored ventilation, respiratory function stable and patient connected to nasal cannula oxygen Cardiovascular status: blood pressure returned to baseline and stable Postop Assessment: no apparent nausea or vomiting Anesthetic complications: no   No notable events documented.  Last Vitals:  Vitals:   12/25/22 1500 12/25/22 1545  BP: (!) 123/58 139/63  Pulse: 79 85  Resp: 12 17  Temp:  36.7 C  SpO2: 99% 98%    Last Pain:  Vitals:   12/25/22 1545  TempSrc:   PainSc: 0-No pain                 Collene Schlichter

## 2023-06-10 ENCOUNTER — Other Ambulatory Visit: Payer: Self-pay | Admitting: Acute Care

## 2023-06-10 DIAGNOSIS — Z87891 Personal history of nicotine dependence: Secondary | ICD-10-CM

## 2023-06-10 DIAGNOSIS — Z122 Encounter for screening for malignant neoplasm of respiratory organs: Secondary | ICD-10-CM

## 2023-07-30 ENCOUNTER — Ambulatory Visit (HOSPITAL_BASED_OUTPATIENT_CLINIC_OR_DEPARTMENT_OTHER)
Admission: RE | Admit: 2023-07-30 | Discharge: 2023-07-30 | Disposition: A | Discharge status: Home / Self Care | Payer: Medicare Other | Source: Ambulatory Visit | Attending: Family Medicine | Admitting: Family Medicine

## 2023-07-30 DIAGNOSIS — Z87891 Personal history of nicotine dependence: Secondary | ICD-10-CM | POA: Diagnosis present

## 2023-07-30 DIAGNOSIS — Z122 Encounter for screening for malignant neoplasm of respiratory organs: Secondary | ICD-10-CM

## 2023-08-13 ENCOUNTER — Other Ambulatory Visit: Payer: Self-pay

## 2023-08-13 DIAGNOSIS — Z122 Encounter for screening for malignant neoplasm of respiratory organs: Secondary | ICD-10-CM

## 2023-08-13 DIAGNOSIS — Z87891 Personal history of nicotine dependence: Secondary | ICD-10-CM

## 2024-06-30 ENCOUNTER — Other Ambulatory Visit: Payer: Self-pay | Admitting: Family Medicine

## 2024-06-30 DIAGNOSIS — Z1231 Encounter for screening mammogram for malignant neoplasm of breast: Secondary | ICD-10-CM

## 2024-07-21 ENCOUNTER — Ambulatory Visit
Admission: RE | Admit: 2024-07-21 | Discharge: 2024-07-21 | Disposition: A | Source: Ambulatory Visit | Attending: Family Medicine | Admitting: Family Medicine

## 2024-07-21 DIAGNOSIS — Z1231 Encounter for screening mammogram for malignant neoplasm of breast: Secondary | ICD-10-CM

## 2024-07-31 ENCOUNTER — Ambulatory Visit (HOSPITAL_BASED_OUTPATIENT_CLINIC_OR_DEPARTMENT_OTHER)
